# Patient Record
Sex: Male | Born: 1935 | Race: White | Hispanic: No | Marital: Married | Smoking: Current every day smoker
Health system: Southern US, Community
[De-identification: ages and names within clinical notes are randomized; demographics above are authoritative.]

## PROBLEM LIST (undated history)

## (undated) DIAGNOSIS — A499 Bacterial infection, unspecified: Secondary | ICD-10-CM

## (undated) DIAGNOSIS — G4700 Insomnia, unspecified: Secondary | ICD-10-CM

## (undated) DIAGNOSIS — I679 Cerebrovascular disease, unspecified: Secondary | ICD-10-CM

## (undated) DIAGNOSIS — I890 Lymphedema, not elsewhere classified: Secondary | ICD-10-CM

## (undated) DIAGNOSIS — G629 Polyneuropathy, unspecified: Secondary | ICD-10-CM

## (undated) DIAGNOSIS — I639 Cerebral infarction, unspecified: Secondary | ICD-10-CM

## (undated) DIAGNOSIS — N390 Urinary tract infection, site not specified: Secondary | ICD-10-CM

## (undated) DIAGNOSIS — I10 Essential (primary) hypertension: Principal | ICD-10-CM

## (undated) DIAGNOSIS — K219 Gastro-esophageal reflux disease without esophagitis: Secondary | ICD-10-CM

## (undated) DIAGNOSIS — E860 Dehydration: Secondary | ICD-10-CM

## (undated) DIAGNOSIS — F329 Major depressive disorder, single episode, unspecified: Secondary | ICD-10-CM

## (undated) DIAGNOSIS — D649 Anemia, unspecified: Secondary | ICD-10-CM

## (undated) DIAGNOSIS — R5381 Other malaise: Secondary | ICD-10-CM

## (undated) DIAGNOSIS — R4701 Aphasia: Secondary | ICD-10-CM

## (undated) DIAGNOSIS — G8929 Other chronic pain: Secondary | ICD-10-CM

## (undated) DIAGNOSIS — E876 Hypokalemia: Secondary | ICD-10-CM

## (undated) DIAGNOSIS — I69320 Aphasia following cerebral infarction: Secondary | ICD-10-CM

## (undated) DIAGNOSIS — F32A Depression, unspecified: Secondary | ICD-10-CM

## (undated) HISTORY — DX: Cerebral infarction, unspecified: I63.9

## (undated) HISTORY — DX: Essential (primary) hypertension: I10

## (undated) HISTORY — DX: Bacterial infection, unspecified: A49.9

## (undated) HISTORY — DX: Aphasia following cerebral infarction: I69.320

## (undated) HISTORY — DX: Gastro-esophageal reflux disease without esophagitis: K21.9

## (undated) HISTORY — DX: Urinary tract infection, site not specified: N39.0

## (undated) HISTORY — DX: Other malaise: R53.81

---

## 2001-02-28 ENCOUNTER — Encounter: Payer: Self-pay | Admitting: Emergency Medicine

## 2001-02-28 ENCOUNTER — Inpatient Hospital Stay (HOSPITAL_COMMUNITY): Admission: EM | Admit: 2001-02-28 | Discharge: 2001-03-03 | Payer: Self-pay

## 2001-03-03 ENCOUNTER — Encounter: Payer: Self-pay | Admitting: Cardiology

## 2001-03-11 ENCOUNTER — Encounter: Admission: RE | Admit: 2001-03-11 | Discharge: 2001-06-09 | Payer: Self-pay | Admitting: Cardiothoracic Surgery

## 2001-09-06 ENCOUNTER — Encounter: Payer: Self-pay | Admitting: Emergency Medicine

## 2001-09-06 ENCOUNTER — Inpatient Hospital Stay (HOSPITAL_COMMUNITY): Admission: EM | Admit: 2001-09-06 | Discharge: 2001-09-18 | Payer: Self-pay | Admitting: Emergency Medicine

## 2001-09-07 ENCOUNTER — Encounter: Payer: Self-pay | Admitting: Neurology

## 2001-09-09 ENCOUNTER — Encounter: Payer: Self-pay | Admitting: Neurology

## 2001-10-23 ENCOUNTER — Ambulatory Visit (HOSPITAL_COMMUNITY): Admission: RE | Admit: 2001-10-23 | Discharge: 2001-10-23 | Payer: Self-pay | Admitting: Internal Medicine

## 2001-10-23 ENCOUNTER — Encounter (HOSPITAL_BASED_OUTPATIENT_CLINIC_OR_DEPARTMENT_OTHER): Payer: Self-pay | Admitting: Internal Medicine

## 2001-12-23 ENCOUNTER — Encounter (HOSPITAL_BASED_OUTPATIENT_CLINIC_OR_DEPARTMENT_OTHER): Payer: Self-pay | Admitting: Internal Medicine

## 2001-12-23 ENCOUNTER — Ambulatory Visit (HOSPITAL_COMMUNITY): Admission: RE | Admit: 2001-12-23 | Discharge: 2001-12-23 | Payer: Self-pay | Admitting: Internal Medicine

## 2002-02-24 ENCOUNTER — Encounter: Payer: Self-pay | Admitting: Emergency Medicine

## 2002-02-24 ENCOUNTER — Encounter (INDEPENDENT_AMBULATORY_CARE_PROVIDER_SITE_OTHER): Payer: Self-pay | Admitting: *Deleted

## 2002-02-24 ENCOUNTER — Inpatient Hospital Stay (HOSPITAL_COMMUNITY): Admission: EM | Admit: 2002-02-24 | Discharge: 2002-02-27 | Payer: Self-pay | Admitting: Emergency Medicine

## 2002-03-03 ENCOUNTER — Ambulatory Visit (HOSPITAL_COMMUNITY): Admission: RE | Admit: 2002-03-03 | Discharge: 2002-03-03 | Payer: Self-pay | Admitting: Internal Medicine

## 2003-09-20 ENCOUNTER — Emergency Department (HOSPITAL_COMMUNITY): Admission: EM | Admit: 2003-09-20 | Discharge: 2003-09-20 | Payer: Self-pay | Admitting: Emergency Medicine

## 2004-01-19 ENCOUNTER — Emergency Department (HOSPITAL_COMMUNITY): Admission: EM | Admit: 2004-01-19 | Discharge: 2004-01-19 | Payer: Self-pay | Admitting: Emergency Medicine

## 2004-02-02 ENCOUNTER — Emergency Department (HOSPITAL_COMMUNITY): Admission: EM | Admit: 2004-02-02 | Discharge: 2004-02-02 | Payer: Self-pay | Admitting: Emergency Medicine

## 2004-04-26 ENCOUNTER — Encounter (HOSPITAL_BASED_OUTPATIENT_CLINIC_OR_DEPARTMENT_OTHER): Admission: RE | Admit: 2004-04-26 | Discharge: 2004-05-03 | Payer: Self-pay | Admitting: Internal Medicine

## 2004-08-15 ENCOUNTER — Emergency Department (HOSPITAL_COMMUNITY): Admission: EM | Admit: 2004-08-15 | Discharge: 2004-08-15 | Payer: Self-pay | Admitting: Emergency Medicine

## 2004-11-27 ENCOUNTER — Emergency Department (HOSPITAL_COMMUNITY): Admission: EM | Admit: 2004-11-27 | Discharge: 2004-11-27 | Payer: Self-pay | Admitting: Emergency Medicine

## 2004-11-29 ENCOUNTER — Inpatient Hospital Stay (HOSPITAL_COMMUNITY): Admission: EM | Admit: 2004-11-29 | Discharge: 2004-12-07 | Payer: Self-pay | Admitting: Emergency Medicine

## 2005-07-26 ENCOUNTER — Ambulatory Visit (HOSPITAL_COMMUNITY): Admission: RE | Admit: 2005-07-26 | Discharge: 2005-07-26 | Payer: Self-pay | Admitting: Internal Medicine

## 2006-11-10 ENCOUNTER — Inpatient Hospital Stay (HOSPITAL_COMMUNITY): Admission: EM | Admit: 2006-11-10 | Discharge: 2006-11-12 | Payer: Self-pay | Admitting: Emergency Medicine

## 2006-11-15 ENCOUNTER — Ambulatory Visit: Payer: Self-pay | Admitting: Gastroenterology

## 2007-07-06 ENCOUNTER — Ambulatory Visit: Payer: Self-pay | Admitting: Surgery

## 2007-07-06 ENCOUNTER — Encounter (INDEPENDENT_AMBULATORY_CARE_PROVIDER_SITE_OTHER): Payer: Self-pay | Admitting: Emergency Medicine

## 2007-07-06 ENCOUNTER — Emergency Department (HOSPITAL_COMMUNITY): Admission: EM | Admit: 2007-07-06 | Discharge: 2007-07-06 | Payer: Self-pay | Admitting: Emergency Medicine

## 2008-12-31 ENCOUNTER — Inpatient Hospital Stay (HOSPITAL_COMMUNITY): Admission: EM | Admit: 2008-12-31 | Discharge: 2009-01-04 | Payer: Self-pay | Admitting: Emergency Medicine

## 2010-11-06 LAB — POCT CARDIAC MARKERS
CKMB, poc: <1 ng/mL — ABNORMAL LOW (ref 1.0–8.0)
Myoglobin, poc: 69.4 ng/mL (ref 12–200)
Troponin i, poc: <0.05 ng/mL (ref 0.00–0.09)

## 2010-11-06 LAB — BASIC METABOLIC PANEL
BUN: 13 mg/dL (ref 6–23)
BUN: 17 mg/dL (ref 6–23)
BUN: 7 mg/dL (ref 6–23)
CO2: 22 mEq/L (ref 19–32)
CO2: 23 mEq/L (ref 19–32)
CO2: 23 mEq/L (ref 19–32)
Calcium: 8.9 mg/dL (ref 8.4–10.5)
Chloride: 113 mEq/L — ABNORMAL HIGH (ref 96–112)
Chloride: 118 mEq/L — ABNORMAL HIGH (ref 96–112)
Creatinine, Ser: 0.65 mg/dL (ref 0.4–1.5)
Creatinine, Ser: 0.71 mg/dL (ref 0.4–1.5)
Creatinine, Ser: 0.75 mg/dL (ref 0.4–1.5)
GFR calc Af Amer: >60 mL/min (ref >60)
GFR calc non Af Amer: >60 mL/min (ref >60)
Potassium: 3.5 mEq/L (ref 3.5–5.1)
Potassium: 3.8 mEq/L (ref 3.5–5.1)
Sodium: 140 mEq/L (ref 135–145)
Sodium: 145 mEq/L (ref 135–145)

## 2010-11-06 LAB — CULTURE, BLOOD (ROUTINE X 2)
Culture: NO GROWTH
Culture: NO GROWTH

## 2010-11-06 LAB — URINALYSIS, ROUTINE W REFLEX MICROSCOPIC
Bilirubin Urine: NEGATIVE
Glucose, UA: NEGATIVE mg/dL
Specific Gravity, Urine: 1.013 (ref 1.005–1.030)
pH: 7 (ref 5.0–8.0)

## 2010-11-06 LAB — GLUCOSE, CAPILLARY
Glucose-Capillary: 102 mg/dL — ABNORMAL HIGH (ref 70–99)
Glucose-Capillary: 197 mg/dL — ABNORMAL HIGH (ref 70–99)
Glucose-Capillary: 84 mg/dL (ref 70–99)
Glucose-Capillary: 88 mg/dL (ref 70–99)
Glucose-Capillary: 92 mg/dL (ref 70–99)
Glucose-Capillary: 94 mg/dL (ref 70–99)

## 2010-11-06 LAB — CBC
HCT: 47.3 % (ref 39.0–52.0)
Hemoglobin: 15.2 g/dL (ref 13.0–17.0)
Hemoglobin: 16 g/dL (ref 13.0–17.0)
MCHC: 33.7 g/dL (ref 30.0–36.0)
MCHC: 34.2 g/dL (ref 30.0–36.0)
MCV: 91.7 fL (ref 78.0–100.0)
MCV: 92.6 fL (ref 78.0–100.0)
Platelets: 103 10*3/uL — ABNORMAL LOW (ref 150–400)
Platelets: 96 10*3/uL — ABNORMAL LOW (ref 150–400)
Platelets: 97 10*3/uL — ABNORMAL LOW (ref 150–400)
RBC: 4.91 MIL/uL (ref 4.22–5.81)
RBC: 5.11 MIL/uL (ref 4.22–5.81)
RDW: 14.6 % (ref 11.5–15.5)
WBC: 7.3 10*3/uL (ref 4.0–10.5)

## 2010-11-06 LAB — COMPREHENSIVE METABOLIC PANEL
Albumin: 3.1 g/dL — ABNORMAL LOW (ref 3.5–5.2)
BUN: 22 mg/dL (ref 6–23)
Creatinine, Ser: 0.77 mg/dL (ref 0.4–1.5)
GFR calc non Af Amer: >60 mL/min (ref >60)
Glucose, Bld: 105 mg/dL — ABNORMAL HIGH (ref 70–99)
Potassium: 4.6 mEq/L (ref 3.5–5.1)
Sodium: 142 mEq/L (ref 135–145)
Total Bilirubin: 0.3 mg/dL (ref 0.3–1.2)
Total Protein: 6.5 g/dL (ref 6.0–8.3)

## 2010-11-06 LAB — DIFFERENTIAL
Basophils Absolute: 0 10*3/uL (ref 0.0–0.1)
Eosinophils Relative: 2 % (ref 0–5)
Lymphs Abs: 1.7 10*3/uL (ref 0.7–4.0)
Monocytes Absolute: 0.4 10*3/uL (ref 0.1–1.0)
Neutrophils Relative %: 64 % (ref 43–77)

## 2010-11-06 LAB — CK TOTAL AND CKMB (NOT AT ARMC)
CK, MB: 1.7 ng/mL (ref 0.3–4.0)
Relative Index: INVALID (ref 0.0–2.5)

## 2010-11-06 LAB — URINE CULTURE
Colony Count: NO GROWTH
Culture: NO GROWTH

## 2010-11-06 LAB — CARDIAC PANEL(CRET KIN+CKTOT+MB+TROPI)
CK, MB: 1.5 ng/mL (ref 0.3–4.0)
Relative Index: INVALID (ref 0.0–2.5)
Troponin I: 0.01 ng/mL (ref 0.00–0.06)

## 2010-11-06 LAB — TROPONIN I: Troponin I: 0.02 ng/mL (ref 0.00–0.06)

## 2010-12-12 NOTE — Discharge Summary (Signed)
NAME:  Bradley Boyle, Bradley Boyle NO.:  [id number]   MEDICAL RECORD NO.:  [id number]          PATIENT TYPE:  INP   LOCATION:  6737                         FACILITY:  [facility]   PHYSICIAN:  Lonia Blood, M.D.DATE OF BIRTH:  03/04/1930   DATE OF ADMISSION:  12/31/2008  DATE OF DISCHARGE:                               DISCHARGE SUMMARY   PRIMARY CARE PHYSICIAN:  Medical laboratory scientific officer.   DISCHARGE DIAGNOSES:  1. Severe hypotension.      a.     No clinical evidence of sepsis.      b.     Most consistent with simple dehydration.      c.     Felt to be secondary to low p.o. intake combined with Lasix       therapy.  2. Initial concern for right lower lobe pneumonia - no evidence of      such in follow-up.  3. 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  4. Hypertension - Norvasc initiated in place of Lasix.  5. Hypothyroidism - TSH at goal.  6. Diet-controlled diabetes mellitus.  7. History of gastric and duodenal arteriovenous malformations -      hemoglobin stable during hospital stay.   DISCHARGE MEDICATIONS:  1. Claritin 10 mg p.o. daily.  2. Lyrica 75 mg p.o. t.i.d.  3. Protonix 40 mg p.o. daily.  4. Trazodone 25 mg p.o. every night.  5. Multivitamin 1 p.o. daily.  6. Ascorbic acid 500 mg p.o. daily.  7. Baclofen 5 mg p.o. b.i.d.  8. Tylenol 650 mg p.o. q.4 h. p.r.n.  9. Norvasc 10 mg p.o. daily.   FOLLOW UP:  Ongoing care will be provided by the attending of record at  the patient's skilled nursing facility of choice.  The patient should  receive a BMET within a week's time to reassess his BUN and creatinine  ratio.  He should be followed closely for his oral intake.  It should be  assured that he encouraged to maintain adequate oral intake at all  times.   CONSULTATIONS:  [facility] per ED physician.   PROCEDURE:  CT scan of the head on December 31, 2008:  No acute intracranial  abnormalities.  Old left middle cerebral artery distribution CVA.  Moderate generalized atrophy including cerebellar atrophy.   HOSPITAL COURSE:  Mr. Bradley Boyle is a very pleasant 77 year old  gentleman who suffered a significantly large left middle artery  distribution CVA in the past.  This has left him with right hemiparesis  and persistent dysarthria.  The patient was brought to the hospital  after he was found to be somewhat lethargic and complaining of  dizziness.  In the nursing home, he had an initial blood pressure of  70/45.  Heart rate was in the 100 to 140 range at that time.  The  patient was given 500 mL normal saline bolus by EMS.  On arrival in the  emergency room, he was slurring his speech.  It was not clear that this  actually was most likely his baseline to  the ER physician.  CT scan of  the head was obtained and was unrevealing.  Neurology consultation was  carried out, however.  The neurologist did not feel that this  represented an acute neurologic event and therefore signed off on the  patient.  The patient was admitted to the acute unit.  He was hydrated.  Chest x-ray in the ER had revealed a question of possible right lower  lobe infiltrate.  The patient was therefore empirically placed on  antibiotics.  With hydration, the patient's symptoms improved  significantly.  Follow-up chest x-ray failed to reveal any evidence of a  right lower lobe pneumonia.  They were therefore discontinued.  At the  present time, the patient's Lasix has been discontinued.  Would continue  IV fluid hydration, and we are initiating Norvasc 10 mg daily for blood  pressure control.  We will assess patient's blood pressure for adequate  control on Norvasc.   The patient remained stable overnight.  It is anticipated that he will  be cleared for return to the skilled nursing facility on the medication  regimen listed above.      Lonia Blood, M.D.  Electronically  Signed     JTM/MEDQ  D:  01/03/2009  T:  01/03/2009  Job:  [id number]   cc:   [facility]

## 2010-12-12 NOTE — H&P (Signed)
NAME:  ELIZEO, RODRIQUES NO.:  [id number]   MEDICAL RECORD NO.:  [id number]          PATIENT TYPE:  INP   LOCATION:  6737                         FACILITY:  [facility]   PHYSICIAN:  Zannie Cove, MD     DATE OF BIRTH:  1930-03-27   DATE OF ADMISSION:  12/31/2008  DATE OF DISCHARGE:                              HISTORY & PHYSICAL   PRIMARY CARE PHYSICIAN:  Karlene Einstein, M.D.   CHIEF COMPLAINT:  Low blood pressure.   HISTORY OF PRESENTING ILLNESS:  Mr. Lage is a 77 year old gentleman  with multiple medical problems, resident of a skilled nursing facility,  sent to the emergency room as he reported feeling dizzy today and was  noted to have a blood pressure of 70/45 and heart rate of 100 to 140  range.  The EMS on arrival gave him 500 mL normal saline bolus, which  improved his blood pressure.  The patient denies any fevers or chills.  No chest pain, no shortness of breath, no palpitations.  He denies any  cough.  No abdominal pain, no nausea, no vomiting, no diarrhea, no  dysuria, or urinary frequency.  No new medications or change in his  medications.  The hypotension and tachycardia had resolved upon arrival  in the emergency department.   PAST MEDICAL HISTORY:  1. CVA with residual aphasia.  2. Diabetes.  3. Chronic pain/neuropathy.  4. Hypotension.  5. Anemia.  6. Depression.  7. Gastroesophageal reflux disease.  8. AVMs of the stomach and duodenum.  9. Constipation.   PAST SURGICAL HISTORY:  No prior surgeries.   MEDICATIONS:  Baclofen 5 mg p.o. twice a day, Senokot 1 tablet twice a  day, Claritin 10 mg once a day, Lyrica 75 mg three times a day, Prilosec  20 mg once a day, trazodone 25 mg at bedtime, multivitamin 1 tablet once  a day, MiraLax 17 g once a day, Vicodin 5/500 one tablet four times a  day as needed, Lasix 20 mg once a day, and vitamin C 500 mg once a day.   ALLERGIES:  The patient is allergic to [facility].   SOCIAL HISTORY:   He lives at a skilled nursing facility.  Needs  assistance in almost all activities of daily living.  Former smoker and  former alcoholic.  Does not smoke or use alcohol currently.   FAMILY HISTORY:  Unknown.   REVIEW OF SYSTEMS:  A 12-system review negative, except per HPI.   PHYSICAL EXAMINATION:  VITAL SIGNS:  Temperature is 97.9, pulse is 68,  blood pressure 146/91, respirations 16, and sating 96% on 2 L.  GENERAL:  He is alert, awake, oriented to place and person, has a  dysarthric speech.  HEENT:  Pupils equal and reactive to light.  Extraocular movements  intact.  Dry mucous membranes.  CARDIOVASCULAR:  S1, S2.  Regular rate and rhythm.  No murmurs, rubs, or  gallops.  LUNGS:  Scattered rhonchi in the right lung, mid and lung base.  CHEST:  Clear.  ABDOMEN:  Soft, nontender.  Positive bowel sounds.  Question of  splenomegaly.  EXTREMITIES:  No  edema, clubbing, or cyanosis.   LABORATORY DATA:  White count of 6.1 with 64% neutrophils, hemoglobin  15.2, and platelets 96.  Chemistry:  Sodium 142, potassium 4.6, chloride  111, bicarb 24, BUN 22, creatinine 0.7, glucose 105, AST 38, ALT 28,  total protein 6.5, albumin 3.1, CK-MB less than 1.  Troponin less than  0.05.  EKG; sinus with the rate of 66, left axis deviation, left  anterior fascicular block, no acute ST-T changes.  Urinalysis is normal.  Chest x-ray shows right basilar atelectasis and possible pneumonia.  CT  head with no acute intracranial abnormalities.   ASSESSMENT AND PLAN:  A 77 year old gentleman with sepsis likely  secondary to healthcare associated pneumonia.  1. Hypotension.  Tachycardia has resolved with the one time fluid      bolus.   Although clinically, the patient denies any symptoms of pneumonia and  does not have leukocytosis, given the initial presentation with blood  pressure of 70/44 and heart rate of 140 and presence of an infiltrate on  x-ray, we will treat his healthcare associated  pneumonia.   The patient has already received a dose of vancomycin and Zosyn in the  ED, we will continue the same per pharmacy protocol.  In addition check  blood, urine, and serum cultures.   Hydration with IV fluids, normal saline at 100 mL an hour.   We will also get 2 more sets of cardiac enzymes to rule out any acute  cardiac event and we will monitor on telemetry.  1. Diabetes.  Start the patient on ADA diet with sliding scale      insulin.  2. Chronic pain neuropathy.  Continue baclofen, Lyrica and Vicodin.  3. Deep vein thrombosis prophylaxis with Lovenox 40 mg subcu daily.   CODE STATUS:  The patient is a DNR/DNI.      Zannie Cove, MD  Electronically Signed     PJ/MEDQ  D:  12/31/2008  T:  01/01/2009  Job:  [id number]

## 2010-12-12 NOTE — Consult Note (Signed)
NAME:  Bradley Boyle, Bradley Boyle NO.:  [id number]   MEDICAL RECORD NO.:  [id number]          PATIENT TYPE:  INP   LOCATION:  6737                         FACILITY:  [facility]   PHYSICIAN:  Bradley Boyle, MDDATE OF BIRTH:  1929/08/16   DATE OF CONSULTATION:  12/31/2008  DATE OF DISCHARGE:                                 CONSULTATION   Bradley Boyle is seen today in consultation at the request of Bradley Boyle  of the [facility] Emergency Department for acute CVA.  The patient was  last known normal at 8:11 p.m. this evening.   HISTORY OF PRESENT ILLNESS:  This is a 77 year old right-handed  Caucasian male who presents from [facility].  I did  speak personally with the patient's nurse on the phone at the nursing  facility to obtain the course of tonight's events.  The nurse who was  with Bradley Boyle at the time of the onset of this symptoms states that  the patient started to complain of some lightheadedness and dizziness  earlier in the day.  This evening, he was feeling quite fatigued and the  nurses were accompanied him to his room.  Upon sitting down, he became  quite diaphoretic and pale.  They went to check his blood pressure and  stated that his blood pressure continued to drop, the lowest pressure  they recorded was 70/44.  At the same time, they were monitoring his  oxygen saturation, which dropped from 94% to 88%.  At the nursing  facility, his blood glucose was checked as he has had a history of  hypoglycemia.  The patient's blood glucose was 110.  EMS was called and  the patient was transported to [facility] Emergency Department for  severe hypotension.  In the [facility] Emergency Department, it was  noted that the patient had aphasic speech and a code stroke was called.   OTHER PAST MEDICAL HISTORY:  1. History of a large left MCA ischemic stroke with residual right-      sided weakness, spasticity and moderate to severe expressive  aphasia.  2. Hypertension.  3. History of an upper GI bleed secondary to AVM in April 2008.  4. Depression.  5. Type 2 diabetes.  6. Hypothyroidism.  7. Anemia.  8. Hypokalemia.  9. Hypoglycemia.  10.History of acute renal insufficiency.  11.Peptic ulcer disease with duodenal ulcer bleed in 2003.  12.Gastroesophageal reflux disease.   The patient has risk factors for TIA and stroke including hypertension  and type 2 diabetes.   CURRENT MEDICATIONS:  Obtained from the nursing facility;  1. Baclofen 5 mg twice a day.  2. Senokot.  3. Multivitamins.  4. Prilosec 20 mg daily.  5. Lyrica 75 mg, three times per day.  6. Percocet p.r.n. pain.  7. Milk of magnesia.  8. Claritin 10 mg daily.  9. Trazodone 25 mg daily at bedtime.   The patient has no known drug allergies.   SOCIAL HISTORY:  He does continue to smoke tobacco products.  He has no  history of alcohol or illicit drug use.  He lives at [facility].   REVIEW OF SYSTEMS:  As detailed above under HPI, otherwise the balance  of 10 systems is unremarkable.   PHYSICAL EXAMINATION:  VITAL SIGNS:  Current weight is 74 kg,  temperature 98.4, blood pressure 146/91, heart rate 68, respiratory rate  16.  HEENT:  Head is normocephalic, atraumatic.  Pupils are equal, round,  reactive to light, and accommodation.  Extraocular movements are full  without nystagmus.  There is no gaze preference.  Oropharynx is clear of  ulcers and exudate.  Palate is symmetric.  NECK:  Supple.  LUNGS:  Coarse breath sounds, right greater than left lower lobe.  HEART:  Regular rate and rhythm without murmurs, rubs, or gallops.  ABDOMEN:  Benign.  EXTREMITIES:  +1 lower extremity trace edema without clubbing or  cyanosis.  NEUROLOGIC:  The patient is alert and oriented x3 to person, place, and  time.  Attention is within normal limits.  Speech is aphasic.  The  patient's aphasia does wax and wane.  At times, he is only able to  say  one or two words and then intermittently he gets out whole sentences.  Cranial nerves II through XII are intact with the exception of a right  lower facial droop.  MUSCULOSKELETAL:  The patient has increased tone especially in the right  upper extremity with severe residual spasticity.  His right upper  extremity strength is 1/5, right lower extremity strength is 3/5.  The  patient is able to break gravity with the right lower extremity, but the  leg does drift back down; however, it does not touch the bed before a  count of 5.  Left upper and lower extremities are 5/5 throughout.  Toes  are downgoing on the left and upgoing on the right.  Sensory appears to  be intact throughout.  Gait is deferred secondary to acute CVA.  The  patient has smooth pursuit on finger-to-nose testing with the left upper  extremity and heel-to-shin with the left lower extremity.   LABORATORY STUDIES:  Chem-7 is within normal limits with the exception  of low glucose at 92.  CBC is within normal limits with the exception of  platelets low at 96.  Cardiac enzymes are negative x1 for acute  ischemia.  Urinalysis is unremarkable and without infectious process.  Coagulation studies are not performed.  Liver function testing is within  normal limits with the exception of alkaline phosphatase elevated at  116, albumin low at 3.1.  Calcium is within normal limits at 8.5.  Chest  x-ray showing a right lower lobe pneumonia with a previously noted  aortic arch aneurysm.  CT of the head is without acute bleed showing no  new hypodensities and a large chronic left MCA ischemic stroke.   ASSESSMENT AND PLAN:  This is a very pleasant 77 year old Caucasian male  who presents as an acute cerebrovascular accident.  However, his history  and exam are inconsistent.  His history is c/w hypotension, hypoxia,  presyncopal episode and his exam, neurologic deficits are consistent  with his chronic left middle cerebral artery  ischemic stroke with  residual right-sided weakness, right upper extremity spasticity and  aphasic speech.  His aphasia does fluctuate and at times he is able to  get out whole sentences, at other times, he appears mute and sometimes  he only is able to find a few words.  This may be secondary to his  ongoing infectious process.  It is likely that some of his residual  stroke symptoms have been exacerbated by his right lower lobe pneumonia.  I doubt the patient has a new ischemic stroke and would not pursue a  complete stroke workup.  The patient is not on any medications for  secondary stroke prevention.  This may be secondary to the two  gastrointestinal bleeds, which he has had over the past 7 years and his  thrombocytopenia.  He is also not on statin therapy and I do not know  the status of his lipids.  I would encourage the patient towards tobacco  cessation.  This patient did not receive IV TPA secondary to chronic  symptoms with no new neurologic deficits on exam.  He did have a stroke  scale of 10, but this is secondary to his residual deficits.  In  addition, the patient has a platelet count of 96 which is less than 100,  which is contraindicated for IV TPA.  This patient will be followed in  the a.m. by [facility].      Bradley Fiddler, MD  Electronically Signed     NA/MEDQ  D:  01/01/2009  T:  01/01/2009  Job:  [id number]

## 2010-12-12 NOTE — Discharge Summary (Signed)
NAME:  Bradley Boyle, Bradley Boyle NO.:  [id number]   MEDICAL RECORD NO.:  [id number]          PATIENT TYPE:  INP   LOCATION:  6737                         FACILITY:  [facility]   PHYSICIAN:  Pedro Earls, MD     DATE OF BIRTH:  17-Apr-1930   DATE OF ADMISSION:  12/31/2008  DATE OF DISCHARGE:                               DISCHARGE SUMMARY   ADDENDUM   I have seen and examined the patient.  The patient is stable to be  discharged.  There were no acute events happened overnight.  Discuss the  case with the case management, escrow.  Please refer to the dictated med  list for the discharge medication.      Pedro Earls, MD  Electronically Signed     NS/MEDQ  D:  01/04/2009  T:  01/05/2009  Job:  [id number]

## 2010-12-15 NOTE — Discharge Summary (Signed)
NAME:  Bradley Boyle, BRISK NO.:  [id number]   MEDICAL RECORD NO.:  [id number]          PATIENT TYPE:  INP   LOCATION:  3040                         FACILITY:  [facility]   PHYSICIAN:  Fleet Contras, M.D.    DATE OF BIRTH:  [birth year]   DATE OF ADMISSION:  11/29/2004  DATE OF DISCHARGE:  12/06/2004                                 DISCHARGE SUMMARY   PRESENTATION:  Bradley Boyle is a 77 year old Caucasian gentleman who is a  resident at [facility], where he was picked up by the  EMS and brought to the emergency room at [facility].  He presented  with a few days of pain, swelling and redness of his right hand associated  with fever and weakness; this had been progressively getting worse.  He  denied any injury to the right hand and had no history of any kind of  trauma.  On evaluation in the emergency room, he was noted to be febrile,  mildly dehydrated, in mild-to-moderate painful distress.  The dorsum of his  right hand was swollen, erythematous and very tender to touch.  He was  evaluated by the emergency room physician with an x-ray of the right hand  which did not show any acute bony injuries.  Routine blood counts showed  elevated white count at over 18,000.  He has comorbid conditions of diabetes  mellitus, history of CVA with right spastic hemiplegic and he was more or  less wheelchair- and bed-bound; due to these comorbid conditions, he was  thought to be best served by inpatient hospitalization and intravenous  antibiotic therapy; he was therefore admitted to a medical bed and started  on intravenous fluids and intravenous antibiotics.   HOSPITAL COURSE:  On admission, the patient was started on half normal  saline at 75 mL an hour as well as intravenous Zosyn 3.375 g q.6 h.  His  right hand was elevated and Physical Therapy and Occupational Therapy  consultations were requested.  The patient began to improve with  defervescence as  well as his white count returned to normal limits on the  above therapy; however, on Dec 02, 2004, he began to run another bout of  fever up to 103.  Routine septic workup revealed a urinary tract infection  and he was treated with oral ciprofloxacin with improvement in fever and  maintenance of normal white blood count levels.  Physical Therapy and  Occupational Therapy evaluated the patient and recommended that he will be  best served in a skilled nursing facility rather than an extended care  facility.  He has now been evaluated by the case manager and efforts have  been made to secure a skilled nursing facility for his discharge.  Further  problems during his hospitalization included constipation which was treated  with laxative and this was followed by diarrhea and hypokalemia which was  repleted both intravenously and orally.  He also has normochromic/normocytic  anemia which was thought to be due to anemia of chronic disease.  Efforts to  get a Hemoccult have so far proved impossible.  Today, he is  not in acute  respiratory or painful distress.  He is lying comfortably in bed.  His vital  signs show a blood pressure of 164/83, heart rate of 75, respiratory rate of  20, temperature is 97.1.  Oxygen saturation on room air is 92%.  His neck is  supple with no elevated JVD.  He is well-hydrated.  He is not icteric and he  is not cyanotic.  His chest shows good air entry bilaterally with no added  sounds.  Heart sounds 1 and 2 are heard with no murmurs, no S3 gallops.  Abdomen is soft and nontender.  No masses.  Bowel sounds are present.  Extremities show no edema or calf tenderness of the lower extremities.  His  right hand shows resolution of swelling, erythema and tenderness of the  dorsum of the right hand.  CNS:  He is alert and oriented x3.  He has a  right-sided spastic hemiplegia.   Recent laboratory data showed white count of 5.1, hemoglobin 9.7, hematocrit  28.8, platelet count  of 232,000.  His sodium on Dec 05, 2004 shows a sodium  of 135, potassium of 3.6, chloride 102, bicarbonate of 26, BUN of 9,  creatinine 0.7, glucose of 191.  Blood cultures x2 sets obtained on  admission and on Dec 01, 2004 have all returned negative.   ASSESSMENT:  Bradley Boyle is a 77 year old Caucasian gentleman admitted  from assisted living facility with cellulitis of the right hand.  He has  received intravenous and oral antibiotics, and is considered stable for  discharge now.  He has been recommended for a skilled nursing facility that  is appropriate for his level of care.   DISCHARGE DIAGNOSES:  1.  Cellulitis of the right hand.  2.  Dehydration, resolved.  3.  Hypopotassemia, resolved.  4.  Anemia of chronic disease.  5.  Diabetes mellitus, well-controlled.  6.  Hypertension, moderately controlled.  7.  History of cerebrovascular accident, unchanged.   DISCHARGE MEDICATIONS:  Discharge medications include:  1.  Multivitamins -- one a day.  2.  Prilosec 20 mg daily.  3.  Cardizem CD 180 mg daily.  4.  Avandia 4 mg daily.  5.  Iron sulfate 325 mg daily.  6.  Senna two tablets daily.  7.  Lasix 40 mg with potassium 20 mEq once a day p.r.n. for leg swelling.  8.  Duragesic patch 25 mcg to be applied q.72 h.  9.  Vicodin 5/500 mg one to two tablets q.6 h. p.r.n.  10. Nortriptyline 50 mg nightly p.r.n.  11. Ativan 0.5 mg one p.o. nightly p.r.n.  12. Albuterol MDI two puffs q.6 h. p.r.n.  13. Aspirin 325 mg daily.  14. Baclofen 10 mg one p.o. b.i.d.  15. Extra-Strength Tylenol 500 mg one to two tablets q.6 h. p.r.n.  16. Lexapro 10 mg p.o. daily.  17. Glucerna one can p.o. t.i.d.  18. Keflex 500 mg one p.o. four times daily for 5 more days.  19. Ciprofloxacin 250 mg one p.o. b.i.d. for 3 more days.   ACTIVITY:  He is to continue with physical therapy and occupational therapy  at the skilled nursing facility.  DIET:  He is to stay on an 1800-calorie kilocalorie ADA diet  and is to apply  pressure area care.   FOLLOWUP:  Followup will be by me in 3 weeks; he is to call for appointment  at [phone].   DISPOSITION:  To skilled nursing facility.   CONDITION ON DISCHARGE:  Stable.      EA/MEDQ  D:  12/06/2004  T:  12/06/2004  Job:  [id number]

## 2010-12-15 NOTE — Consult Note (Signed)
NAME:  Bradley Boyle, Bradley Boyle NO.:  [id number]   MEDICAL RECORD NO.:  [id number]          PATIENT TYPE:  INP   LOCATION:  1826                         FACILITY:  [facility]   PHYSICIAN:  Rachael Fee, MD   DATE OF BIRTH:  [birth year]   DATE OF CONSULTATION:  DATE OF DISCHARGE:                                 CONSULTATION   CONSULTATION REQUESTED:  Dr. Lonia Blood from [facility].   Note:  Bradley Boyle is covering, Drs. Jyothi Mann and  Jeani Hawking this weekend.   REASON FOR CONSULTATION:  Melena, anemia.   HPI:  Bradley Boyle is a 77 year old nursing home resident who cannot  provide much of his own history.  He had a duodenal ulcer, H. pylori  positive in 2003, seen by Dr. Charna Elizabeth.  Since then he has been a  resident at a nearby nursing home for his other chronic diseases  including diabetes, hypothyroidism, status post left-sided stroke in  2003.  He was found recently in his nursing home to have several dark  tarry stools and sent to the emergency room for evaluation.  Here in the  emergency room, he had lab tests performed showing a hemoglobin of 6.5,  BUN of 113, creatinine of 1.7.  Normal liver tests and a normal INR.  He  has 205,000 platelets.  His initial blood pressure in the emergency room  was 78/48 but since then he has responded to some fluid bolus and his  pressure most recently is 98/58 with a pulse of 80.  The patient is  arousable but does not answer my questions intelligibly, so I am not  able to determine whether he has had nausea, vomiting, abdominal pains,  hematemesis, or hematochezia.  He currently denies abdominal pain.   REVIEW OF SYSTEMS:  The patient is unable to give review of system due  to his mental status.   PAST MEDICAL HISTORY:  1. Diabetes.  2. Hypertension.  3. History of peptic ulcer disease in 2003 with a large duodenal      ulcer.  4. H. pylori positive.  5. GERD.  6. Hyperlipidemia.  7.  Depression.   CURRENT MEDICATIONS:  At his nursing home:  Lasix, potassium, Prilosec  20 mg once daily, Lyrica, Cymbalta, methadone 10 mg daily, simvastatin,  metformin, Lotensin, aspirin 325 mg once daily, Baclofen, Cardizem,  Senokot, multivitamin, ferrous sulfate once daily.   ALLERGIES:  No known drug allergies.   SOCIAL HISTORY:  Lives at [facility].  Nonsmoker.  Nondrinker.   FAMILY HISTORY:  Noncontributory.   PHYSICAL EXAMINATION:  VITAL SIGNS:  Most recent blood pressure 98/58,  pulse 80, afebrile, breathing 14 breaths per minute.  CONSTITUTIONAL:  Elderly, chronically ill-appearing man, lying in bed,  sleeping but arousable.  Alert to self.  Mumbles his answers.  Does not  seem that he knows where he is or why he is here.  Does not answer  appropriately.  HEENT:  Eyes:  Extraocular movements are intact.  Mouth:  Oropharynx  moist.  No lesions.  NECK:  Supple. No lymphadenopathy.  CARDIOVASCULAR:  Heart:  Regular rate and rhythm.  LUNGS:  Clear to auscultation bilaterally.  ABDOMEN:  Soft, nontender, nondistended.  Normal bowel sounds.  EXTREMITIES:  No lower extremity edema.  SKIN:  No rashes, lesions are visible.   ASSESSMENT AND PLAN:  This is a 77 year old male with a history of a  large duodenal ulcer, Helicobacter pylori positive in 2003, now with  melena and significant anemia, hypotensive on admission.   Given his history of peptic ulcer disease in the past, he is at risk for  recurrent peptic ulcer disease.  That may indeed be what has been  bleeding and causing his anemia and shock.  May also have other upper GI  source including Dieulafoy lesions, neoplasia, but that seems less  likely in this setting.   The plan is to hold his aspirin, transfuse blood.  He will need three to  four units, at least, I suspect.  He will be observed in the Intensive  Care Unit.  He will be started on IV Protonix twice daily, some given in  the emergency room.  Keep  him NPO for an EGD to be done tomorrow after  he receives his blood transfusions and hope he perks up a bit.      Rachael Fee, MD  Electronically Signed     DPJ/MEDQ  D:  11/10/2006  T:  11/10/2006  Job:  [id number]   cc:   Anselmo Rod, M.D.  Karlene Einstein, M.D.

## 2010-12-15 NOTE — Discharge Summary (Signed)
NAME:  SEITH, AIKEY NO.:  [id number]   MEDICAL RECORD NO.:  [id number]          PATIENT TYPE:  INP   LOCATION:  2926                         FACILITY:  [facility]   PHYSICIAN:  Lonia Blood, M.D.       DATE OF BIRTH:  1929/08/26   DATE OF ADMISSION:  11/10/2006  DATE OF DISCHARGE:                               DISCHARGE SUMMARY   PRIMARY CARE PHYSICIAN:  Karlene Einstein, M.D.   DISCHARGE DIAGNOSES:  1. Hypovolemic shock - resolved.  2. Acute blood loss anemia - resolved.  3. 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  4. Altered mental status on admission secondary to the hypotension -      resolved.  5. 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  6. Hypertension.  7. Diabetes mellitus type 2.  8. Hypothyroidism.  9. Depression.  10.Acute renal insufficiency/dehydration on admission - resolving.   CONDITION AT DISCHARGE:  Mr.  Devino will be transferred back to the  skilled nursing facility.  At the skilled nursing facility he will need  a followup basic metabolic profile and CBC 24-48 hours after arrival to  assure stability of his renal function as well as CBC.  The patient  continues to be a do not resuscitate.   DISCHARGE MEDICATIONS:  1. Baclofen 5 mg by mouth twice a day.  2. Senokot by mouth daily.  3. Multivitamin by mouth daily.  4. Nu-Iron 150 mg daily.  5. Prilosec OTC 20 mg twice daily.  6. Cymbalta 90 mg daily.  7. Lyrica 50 mg three times a day.  8. Percocet 5/325 one by mouth every 4 hours as needed for pain.  9. Tylenol 500 mg one by mouth every 4 hours as needed for pain.   PROCEDURES DURING THIS ADMISSION:  1. On November 10, 2006, the patient underwent transfusion of 3 units of      packed red blood cells.  2. On November 10, 2006, the  patient underwent a portable chest x-ray      with findings of no acute cardiopulmonary disease.  3. On November 11, 2006, the patient underwent an upper endoscopy by Dr.      Loreta Ave with findings of duodenal and gastric arteriovenous      malformations.   CONSULTATIONS DURING THIS ADMISSION:  The patient was seen in  consultation by Dr. Rob Bunting from gastroenterology.   HISTORY AND PHYSICAL:  For admission history and physical, refer to the  dictated H&P done by Dr. Lavera Guise November 10, 2006.   HOSPITAL COURSE:  #1 - HYPOVOLEMIC HEMORRHAGIC SHOCK WITH HYPOTENSION  SECONDARY TO GASTROINTESTINAL BLEEDING.  Mr. Sevillano was admitted to  the intensive care unit of [facility] where he received  intensive fluid resuscitation and transfusion with packed red blood  cells.  The patient's vital signs have stabilized and his discharge  systolic blood pressure is about 120.  The patient's hemoglobin rose  appropriately and the last hemoglobin that we checked was at 11.8.  We  do expect over the course of the next 24-48 hours as the patient's fluid  status reequilibrates that his hemoglobin may drift down to probably a  baseline hemoglobin of 10.  Clinically, Mr. Caraway has stopped  hemorrhaging and an endoscopy confirmed the above findings.  At this  point in time we would recommend discontinuation of the aspirin since  the patient had a real life-threatening bleeding on aspirin and  consideration should be given of maybe using Plavix after careful  discussion with the family.  For now the patient, though, should be off  of any anticoagulation/antiplatelet therapy for at least 1 week.  The  patient should be continued on a proton pump inhibitor and we have  increased that from a once-a-day dose to a twice-a-day dose.   #2 - DIABETES MELLITUS TYPE 2.  Mr. Elwood measured hemoglobin A1c  has been at 5.2.  At this point in time, given the fact that the patient  has renal insufficiency,  I will discontinue his metformin and he will  need basically a good diet and monitoring for hyperglycemia.   #3 - ACUTE RENAL INSUFFICIENCY.  Upon admission the patient's BUN was  about 100 and creatinine rose at 1.6.  With careful hydration, these  numbers have stabilized some.  I have discontinued the patient's  furosemide and ACE inhibitor, and once his fluid balance improves he can  be restarted on those as needed.   #4 - CHRONIC PAIN SYNDROME.  Mr. Bolivar can be continued on his  Lyrica, methadone, and Percocet as needed.      Lonia Blood, M.D.  Electronically Signed     SL/MEDQ  D:  11/11/2006  T:  11/11/2006  Job:  [id number]   cc:   Karlene Einstein, M.D.  Anselmo Rod, M.D.

## 2010-12-15 NOTE — H&P (Signed)
NAME:  Bradley Boyle, Bradley Boyle NO.:  [id number]   MEDICAL RECORD NO.:  [id number]                   PATIENT TYPE:  INP   LOCATION:  3308                                 FACILITY:  [facility]   PHYSICIAN:  Barry Dienes. Eloise Harman, M.D.            DATE OF BIRTH:  [birth year]   DATE OF ADMISSION:  02/24/2002  DATE OF DISCHARGE:                                HISTORY & PHYSICAL   CHIEF COMPLAINT:  Dizziness associated with blood in stools.   HISTORY OF PRESENT ILLNESS:  The patient is a 77 year old white male with  multiple medical problems.  He has been a resident of the [facility] skilled nursing facility since February 2003 after a  left MCA distribution stroke.  He had complete occlusion of the left middle  cerebral artery and for this reason he has been on Coumadin since that time.  This morning, he had a moderate amount of blood mixed in his stools  associated with pallor, so he was transferred to the [facility] Emergency  Room for evaluation and management.   PAST MEDICAL HISTORY:  Significant for having had two CVA's in the left MCA  distribution resulting in expressive aphasia and right hemiparesis.  He also  has gait instability, hypothyroidism, diabetes mellitus--type 2, controlled-  -hypertension, and depression.  He has a history of osteoarthritis of the  lumbar spine.  He has had mild dysphagia but in late May 2003 he had a  modified barium swallow study that showed laryngeal penetration that  resolved with a chin tuck and no evidence of aspiration.   MEDICATIONS PRIOR TO ADMISSION:  1. Coumadin 8 mg p.o. q.d. since February 18, 2002.  2. Pamelor 75 mg p.o. q.h.s.  3. Aspirin 325 mg p.o. q.d.  4. Multivitamin 1 tablet p.o. q.d.  5. Celebrex 200 mg p.o. q.d.  6. Maxzide 25 mg p.o. q.d.  7. Avandia 8 mg p.o. q.d.  8. Paxil 20 mg p.o. q.d.  9. Cardizem CD 180 mg p.o. q.d.  10.      MiraLax 17 g p.o. q.d.   ALLERGIES:  No known drug  allergies.   PAST SURGICAL HISTORY:  No known history of surgery.   CODE STATUS:  Do Not Resuscitate.   POINT OF CONTACT:  Tyon Cerasoli (wife), telephone [phone], and Ellin Mayhew (daughter), telephone [phone] (home) and [phone] (cell).   SOCIAL HISTORY:  He is married and had lived with his wife prior to  admission to [facility].  He has ongoing tobacco use of  approximately one pack per day and no history of alcohol abuse.   FAMILY HISTORY:  Currently not obtainable.   CURRENT REVIEW OF SYSTEMS:  Significant for decreased hearing lately over  the past 2-3 weeks.  He has chronic ataxia due to right hemiparesis.  By  head nods, he indicates that he currently does not have shortness  of breath,  chest pain, abdominal pain, or constipation.  He denies current significant  depression.  At the time of my emergency room evaluation, he appeared  comfortable and indicated by head nods that he felt fine, other than  having  crampy right leg pain.   PHYSICAL EXAMINATION:  VITAL SIGNS:  Blood pressure 97/42, pulse 86,  respiratory rate 24, temperature 97.2, oxygen saturation 99% on 2 L per  minute of nasal cannula oxygen.  GENERAL:  He is an elderly, pale, white male in no apparent distress.  He  was alert and answered questions via head nods.  HEENT:  Pupils were equal, round, and reactive to light and accommodation.  Extraocular movements are intact.  Fundi showed no evidence of papilledema.  Throat was normal.  There is a moderate right facial droop.  NECK:  Supple.  There was no jugular venous distention or carotid bruits.  CHEST:  Clear to auscultation.  HEART:  Regular rate and rhythm.  S1 and S2 were present without murmur,  gallop, or rub.  ABDOMEN:  Normal bowel sounds with no hepatosplenomegaly or tenderness.  RECTAL:  Gross blood in the rectal vault.  EXTREMITIES:  Without cyanosis, clubbing, or edema.  NEUROLOGIC:  He is alert and has severe  expressive aphasia.  He shows no  spontaneous movement of the right upper extremity, which is held at 90  degree flexion.  He has 4/5 strength in the right lower extremity.   LABORATORY STUDIES:  White blood cell count 8.8, hemoglobin 5.5, hematocrit  17.1, platelet count 393.  Serum sodium 130, potassium 2.6, chloride 93, CO2  21, BUN 33, creatinine 1.4, glucose 175, serum albumin 2.8.  INR 7.1, PT  45.4.   EKG telemetry shows normal sinus rhythm.   IMPRESSION AND PLAN:  1. Gastrointestinal bleed.  This is of unclear source and aggravated by his     supratherapeutic INR.  His indication for Coumadin was total occlusion of     the left middle cerebral artery.  We will discontinue Coumadin and use     vitamin K and fresh frozen plasma to normalize his INR.  He will be given     packed red blood cells to replace the blood loss with a goal of getting     his hematocrit greater than approximately 24.  A gastroenterology     consultant has been asked to eventually evaluate him for the source of     his bleeding.  He will be put on empiric proton pump inhibitor treatment     in case the source of his bleed is from peptic ulcer disease.  2. Status post cerebrovascular accident.  He has stable severe deficits from     his left middle cerebral artery stroke.  In the future, he will be     switched to aspirin alone for stroke prophylaxis.  3. Recent hearing loss.  This is of uncertain cause and has developed with     the initiation of Pamelor treatment.  For this reason, Pamelor will be     stopped and he will be switched to Lexapro, which has a very safe side-     effect     profile, prior to his hospital discharge.  4. Hypokalemia.  This is moderately severe and due to treatment with     Maxzide.  His potassium losses will be replaced via intravenous and p.o.     route and his potassium levels will be monitored closely.  Barry Dienes Eloise Harman,  M.D.    DGP/MEDQ  D:  02/24/2002  T:  02/26/2002  Job:  [id number]   cc:   Santina Evans A. Orlin Hilding, M.D.   Charna Elizabeth, M.D.

## 2010-12-15 NOTE — Op Note (Signed)
NAME:  RUE, TINNEL NO.:  [id number]   MEDICAL RECORD NO.:  [id number]                   PATIENT TYPE:  INP   LOCATION:  3308                                 FACILITY:  [facility]   PHYSICIAN:  Charna Elizabeth, M.D.                   DATE OF BIRTH:  07/13/1930   DATE OF PROCEDURE:  02/25/2002  DATE OF DISCHARGE:  02/27/2002                                 OPERATIVE REPORT   PROCEDURE:  Colonoscopy.   ENDOSCOPIST:  Charna Elizabeth, M.D.   INSTRUMENT USED:  Olympus video colonoscope.   INDICATIONS FOR PROCEDURE:  Rectal bleeding in a 77 year old white male with  a history of a stroke and residual deficits on the right side.  The patient  has a history of Coumadin use for atrial fibrillation and presented to the  hospital with passage of red blood clots via the rectum and an elevated INR.  The patient is undergoing a screening colonoscopy to rule out colonic  polyps, masses, etc.   PREPROCEDURE PREPARATION:  Informed consent was procured from the patient's  daughter, Christean Leaf, phone number [phone] (she is power of  attorney for the patient).  The patient prepped with a gallon of NuLytely  the morning of the procedure and received four units of packed red blood  cells and four units of FFP prior to the procedure.   PREPROCEDURE PHYSICAL:  VITAL SIGNS:  The patient had stable vital signs.  He was in normal sinus rhythm with a blood pressure of 124/80, pulse of 97  per minute.  NECK:  Supple.  CHEST:  Clear to auscultation.  S1, S2 regular.  ABDOMEN:  Soft with normal bowel sounds.  NEUROLOGIC:  A dense paresis on the right side.   DESCRIPTION OF PROCEDURE:  The patient was placed in the left lateral  decubitus position and sedated with 40 mg of Demerol and 4 mg of Versed  intravenously.  Once the patient was adequately sedate and maintained on low-  flow oxygen and continuous cardiac monitoring, the Olympus video colonoscope  was advanced  from the rectum to the cecum with difficulty.  There was some  residual stool in the colon.  A few small sessile polyps were seen in the  rectum.  These were not removed considering the patient's DNR status.  The  cecal base was not visualized.  There was a significant amount of residual  stool in the cecum as well.  No large masses or polyps were seen.   IMPRESSION:  1. A few small sessile polyps in rectum, not biopsied.  2. Cecal base not visualized.  Large amount of residual stool in the     dependent areas of the colon.  3. No large masses or polyps seen.  Small lesions could have been missed.    RECOMMENDATIONS:  1. Proceed with an EGD at this time.  2. Further recommendations to be made  after EGD.                                               Charna Elizabeth, M.D.    JM/MEDQ  D:  02/25/2002  T:  03/03/2002  Job:  [id number]   cc:   Barry Dienes. Eloise Harman, M.D.

## 2010-12-15 NOTE — Consult Note (Signed)
NAME:  Bradley Boyle, Bradley Boyle NO.:  [id number]   MEDICAL RECORD NO.:  [id number]                   PATIENT TYPE:  INP   LOCATION:  3308                                 FACILITY:  [facility]   PHYSICIAN:  Inocencio Homes, MD                 DATE OF BIRTH:  10/18/1929   DATE OF CONSULTATION:  DATE OF DISCHARGE:                                   CONSULTATION   REASON FOR CONSULTATION:  Rectal bleed and severe anemia.   ASSESSMENT:  1. Anemia with hemoglobin 5.5.  2. Rectal bleeding with clots.  3. Aphasia.  4. Depression.  5. Hair loss.  6. Hypertension.  7. Diabetes.  8. Hypokalemia.  9. Hyponatremia.  10.      Status post cerebrovascular accident with left middle cerebral     artery thrombotic stroke, right hemiparesis.  11.      Hypoalbuminemia.  12.      Coagulopathy with INR 7.1.  13.      Renal insufficiency probably acute with previous baseline     creatinine 0.7 in February 2003.   RECOMMENDATIONS:  1. Transfuse 2 more units PRBC to keep hemoglobin above 9.  2. Serial H&H.  3. Transfuse FFP to reverse coagulopathy in addition to vitamin K.  4. Stabilize patient with IV fluids to keep blood pressure at least about     100 systolic.  5. For colonoscopy when patient is stable.   HISTORY OF PRESENT ILLNESS:  This is a 77 year old white male with multiple  problems including left MCA embolism in February 2003 with residual right  hemiparesis who has been at extended care and rehabilitation in and out  since February 2003.  He was brought into the ER today secondary to feeling  weak and dizzy and bright red blood per rectum noted by the nurses in  extended care last night as well as this morning.  He had some clots noted  also this morning.  The patient is aphasic, but responds to voice and  command and history is essentially obtained through the daughter who was at  bed side.  Per daughter, no previous history of GI bleed or colonoscopy.  The  patient has a fairly good quality of life prior to this CVA in February  and continues to improve.  The patient denies any evident abdominal pain,  nausea, or vomiting and denies constipation or diarrhea.  No apparent melena  stools prior to this episode of bright red blood per rectum.   PAST MEDICAL HISTORY:  As above.   ALLERGIES:  No known drug allergies.   MEDICATIONS:  1. Paxil 20 mg p.o. q.d.  2. Hydrochlorothiazide 25 mg p.o. q.d.  3. Avandia 8 mg p.o. q.d.  4. Vioxx 25 mg p.o. q.d.  5. Cardizem CD 180 mg q.d.  6. Coumadin 5 mg p.o. q.d. adjusted per INR.  7. Nortriptyline 75 mg q.h.s.  SOCIAL HISTORY:  The patient has been in extended care and rehabilitation  since February 2003.  He previously lived at home with his wife.  No current  tobacco, alcohol, or drug abuse.  The patient has a residual right  hemiparesis and aphasia and so he is disabled.   FAMILY HISTORY:  Per daughter there is no positive history of colon cancer,  diverticulitis, or polyps.  Positive family history of diabetes and  hypertension.   REVIEW OF SYMPTOMS:  Unobtainable at this point.   PHYSICAL EXAMINATION:  VITAL SIGNS:  Temperature 98.4, blood pressure 109/58  initially, dropped to 97/42, and now 123/60, pulse initially 103, but now  86, respirations 24, O2 saturation 99% on room air.  GENERAL:  He is an aphasic, but responsive man in no acute distress.  HEENT:  He is edentulous with mild pallor.  No jaundice.  PERRL and EOMI.  NECK:  Supple.  No JVD.  No lymphadenopathy.  CHEST:  Clear to auscultation bilaterally and symmetrical.  CARDIOVASCULAR:  He had distant heart sounds but they are regular rate and  rhythm.  ABDOMEN:  Soft, scaphoid with pulsatile aorta.  Questionable aneurysm, but  nontender.  Positive bowel sounds.  EXTREMITIES:  No clubbing, cyanosis, edema.  NEUROLOGIC:  He is alert, aphasic with right hemiparesis.   LABORATORIES:  White count 8.8, hemoglobin 5.5, platelets  395,000.  Sodium  130, potassium 2.6, chloride 93, CO2 21, BUN 33, creatinine 1.4, glucose  175, calcium 8.6.  He has an MCV of 83.6 and ALT of 7.  Alkaline phosphatase  is 82, AST 24, ALT 14, total protein 6.1, albumin 2.8, total bilirubin 0.4.  PTT 85, PT 45.4, INR 7.1.   Chest x-ray shows basilar atelectasis.   IMPRESSION:  This is a 77 year old white male status post left middle  cerebral artery cerebrovascular accident in extended care coming with severe  anemia, rectal bleed, and coagulopathy.  The patient has been on Coumadin  therapy for his left internal carotid artery stenosis since February.  His  INR is now very high at 7.1 which might be responsible for his bleeding.  He  is also in the right age for colon cancer as well  as other causes of bright  red bleeding per rectum including diverticular disease, polyps, internal  hemorrhoids, etc.  The patient will be admitted for colonoscopy with or  without biopsy once he is stable.  At this point, however, patient's  coagulopathy needs to be reversed and hemoglobin raised to more than 9  before gastrointestinal intervention.  If patient continues to drop his  hemoglobin or active bleeding returns, we will do an emergent colonoscopy  then.     Lonia Blood, M.D.                         Inocencio Homes, MD    LG/MEDQ  D:  02/24/2002  T:  02/26/2002  Job:  [id number]   cc:   Gwen Pounds, M.D.

## 2010-12-15 NOTE — H&P (Signed)
[place]. [facility]  Patient:    Bradley Boyle, Bradley Boyle Visit Number: [id number] MRN: [id number]          Service Type: MED Location: [phone] Attending Physician:  Durel Salts Dictated by:   Gustavus Messing Orlin Hilding, M.D. Admit Date:  09/06/2001   CC:         [name], M.D.   History and Physical  CHIEF COMPLAINT:  Falls, abnormal speech.  HISTORY OF PRESENT ILLNESS:  History obtained from chart; patient is unable to give history secondary to aphasia.  He was brought in by EMS, who were called after patient fell today.  Wife gave conflicting information to EMS, did not come to hospital and does not answer her phone.  Patient reportedly had previous stroke a few years ago from which he recovered.  Sometime yesterday, he had onset of new right-sided hemiparesis and speech disturbance and then fell today.  REVIEW OF SYSTEMS:  He denies chest pain, shortness of breath or headache.  PAST MEDICAL HISTORY:  According to the chart, he has had elevated TSH, hypercholesterolemia, history of, I guess, depression, adult-onset diabetes and hypertension by his other medications.  MEDICATIONS:  He takes Cartia, diclofenac, Paxil, propranolol, hydrochlorothiazide and Avandia and recently was on a prednisone taper.  ALLERGIES:  No known drug allergies.  SOCIAL HISTORY:  He is married.  Question if he is a smoker; he has got nicotine stains on his finger.  FAMILY HISTORY:  Unobtainable.  PHYSICAL EXAMINATION:  VITAL SIGNS:  Temperature is 98.9, pulse 99, respirations 20, blood pressure 161/75.  HEENT:  Head normocephalic, atraumatic.  NECK:  Supple without bruits.  HEART:  Regular rate and rhythm.  LUNGS:  Clear to auscultation.  EXTREMITIES:  Without edema but he has got abrasions and ecchymoses on his knees and elbows.  NEUROLOGIC:  Mental status:  He is awake and alert and aphasic, says "yep" and "no" with probably about 75%  accuracy.  He cannot name or repeat.  He does follow some commands on the left.  Cranial nerves:  Pupils appear to be equal and reactive.  Visual fields appear to be full.  Extraocular movements are intact.  Facial sensation appears to be normal.  Facial motor activity shows a right central facial droop.  Hearing is intact.  Palate is symmetric and tongue is midline.  Motor exam:  He has got a dense right hemiparesis, normal strength on the left.  Reflexes are brisk on the right, relatively speaking, and decreased on the left.  Downgoing toes.  Coordination:  Cannot understand to follow.  He has got some arthritic-appearing changes in his hands.  Sensory may be decreased to the right lower extremity, it is hard to tell because of the language problem.  LABORATORY AND ACCESSORY DATA:  CT scan of the brain shows what looks like a subacute left middle cerebral artery infarct in the peri-insular region.  Sodium is 129, potassium 3.3, chloride 96, CO2 25, BUN 15, creatinine 0.9, glucose 158.  IMPRESSION:  Left middle cerebral artery stroke -- looks thromboembolic -- with a dense right hemiparesis and aphasia.  PLAN:  Admit for heparin IV, stroke workup with MRI, MR angiogram, carotid ultrasound, 2-D echo and therapies as well as rehab. Dictated by:   Gustavus Messing Orlin Hilding, M.D. Attending Physician:  Durel Salts DD:  09/06/01 TD:  09/07/01 Job: [id number]

## 2010-12-15 NOTE — Discharge Summary (Signed)
[place]. [facility]  Patient:    Bradley Boyle, Bradley Boyle Visit Number: [id number] MRN: [id number]          Service Type: MED Location: [phone] Attending Physician:  Durel Salts Dictated by:   Kelli Hope, M.D. Admit Date:  09/06/2001 Disc. Date: 09/18/01   CC:         Bradley Boyle, M.D.   Discharge Summary  ADMISSION DIAGNOSIS:  Acute left middle cerebral artery stroke with dense right hemiparesis and aphasia.  DISCHARGE DIAGNOSES: 1. 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 2. Left internal carotid artery occlusion requiring temporary chronic    anticoagulation for stump emboli. 3. Hypertension. 4. Diabetes mellitus. 5. History of depression.  CONDITION ON DISCHARGE:  Good.  DIET:  Dysphagia I (pureed) with honey-thick liquids.  ACTIVITY:  Out of bed as tolerated.  DISCHARGE MEDICATIONS: 1. Paxil 20 mg p.o. q.d. 2. Hydrochlorothiazide 25 mg p.o. q.d. 3. Avandia 8 mg p.o. q.d. 4. Vioxx 25 mg p.o. q.d. 5. Cardizem 60 mg p.o. t.i.d. 6. Coumadin 5 mg p.o. q.d., dose adjusted as needed, x 3 months, then okay    to step down to aspirin one p.o. q.d.  STUDIES: 1. CT of the head performed September 06, 2001, revealing subacute bland infarct    of he left MCA distribution affecting the left temporal and parietal    lobes. 2. Plain x-ray of the knee September 06, 2001, revealing degenerative changes    but no fracture or subluxation. 3. MRI of the brain performed September 07, 2001, revealing extensive left    middle cerebral artery territory acute stroke. 4. MR angiography of the neck vessels revealing occlusion of the internal    carotid artery at its origin, 30-40% narrowing of the right internal    carotid artery. 5. Chest x-ray performed September 07, 2001, bilateral lower lobe atelectasis. 6. Modified barium swallow September 09, 2001, with delayed  initiation of    swallowing with residue in the hypopharynx with aspiration of nectar and    thin consistencies. 7. Carotid Doppler study performed September 08, 2001, confirming distal    occlusion of the left internal carotid artery. 8. Transthoracic echocardiogram performed September 08, 2001, revealing normal    LV function and ventricular ejection fraction of 55-65%. 9. Echocardiogram September 06, 2001, with left axis and low voltage but no    acute changes.  LABORATORY DATA:  Repeated CBCs remarkable for mild anemia with hemoglobin in the 12-13 range, MCV approximately 98.  Routine chemistries remarkable for slightly elevated glucose.  Lipid panel normal.  INR of 2.7 on discharge.  HOSPITAL COURSE:  Please see admission H&P by Bradley Boyle. Bradley Boyle, M.D., for full admission details.  Briefly, this is a 77 year old male with the aforementioned medical problems who presented to the emergency room after the patient was found by EMS after a fall.  He was found to have a stroke on admission CT scanning and was subsequently admitted for further evaluation and workup.  Initially placed on heparin and was switched to Coumadin over concern of stump emboli, as he was found to have a left ICA occlusion.  Routine workup was undertaken with the above results.  Speech therapy, physical therapy, and occupational therapy were consulted and assisted in the patients rehabilitation.  He was found to have fairly significant dysphagia but did tolerate a D-I diet with honey-thin liquids.  Rehab was consulted and felt that he  was an appropriate candidate; however, physical evaluation raised a question of his ability to function at home, as his wife has some significant health problems including psychiatric comorbidities, and ultimately after discussion between case management and the patients daughter, it was felt that nursing home placement would be arranged.  Accordingly, arrangements were made for  transfer to [facility].  The patient reached a therapeutic INR on February 18.  Heparin was discontinued on February 19.  He was deemed stable for discharge to nursing home on February 20.  DISPOSITION:  He will be followed at the nursing home p.r.n.  He will have an INR check on February 24 and subjacent adjustment to Coumadin made at that time.  As noted above, Coumadin is to be continued for at least three months, then may be discontinued and switched to aspirin therapy. Dictated by:   Kelli Hope, M.D. Attending Physician:  Durel Salts DD:  09/18/01 TD:  09/18/01 Job: [id number]

## 2010-12-15 NOTE — Discharge Summary (Signed)
[place]. [facility]  Patient:    Bradley Boyle, Bradley Boyle                     MRN: [id number] Adm. Date:  02/28/01 Disc. Date: 03/03/01 Attending:  Luis Abed, M.D. [facility] Dictator:   Gene Serpe, P.A. CC:         Bradley Boyle, M.D.   Referring Physician Discharge [place]  PROCEDURES:  Adenosine Cardiolite, March 03, 2001.  REASON FOR ADMISSION:  The patient is a 77 year old male with no prior history of ischemic heart disease, but with a remote history of supraventricular tachycardia with cardiac risk factors notable for hypertension.  He presented with tachycardia associated with dizziness.  He was admitted for family members and diagnostic evaluation.  LABORATORY DATA:  Cardiac enzymes:  CPK 95/3.8 and 114/4.6; troponin I 0.38 and 0.31.  Lipid profile:  Total cholesterol 141, triglycerides 314, HDL 23, LDL 55.  Elevated TSH 7.33.  Admission metabolic profile:  Sodium 131, potassium 3.9, glucose 199, BUN 22, creatinine 1.3.  Follow-up potassium low at 2.7; 4.1 at discharge.  Elevated hemoglobin A1C at 8.8.  WBC 7.4, HGB 14.8, HCT 42.4, platelets 167 on admission.  D-dimer 0.71.  Admission CXR:  No pulmonary edema; ectatic thoracic aorta.  HOSPITAL COURSE:  The patient was placed on short-acting diltiazem for management of the SVT.  The initial heart rate was 150 on presentation. Luis Abed, M.D., felt this either represented atrial flutter or AVNRT. Of note, the rhythm strips documenting the break in the rhythm were not available.  A subsequent EKG revealed NSR at 60 bpm with left axis deviation.  The TSH was mildly elevated.  Cardiac enzymes showed no significant increase in CPK, but the troponin I was mildly elevated (0.38).  The patient was referred for pharmacologic Cardiolite stress testing and this was interpreted as normal perfusion scan; EF 71%.  The patient was also found to have developed hypokalemia following an initial admission  level of 3.9.  He has a history of hypertension and was on ______ on admission.  Following repletion, this restored the level to 4.1 at discharge.  He will need a follow-up BMP in one week.  The patient also developed hyponatremia (125), but this restored to a normal level of 134 at the time of discharge.  Review of telemetry strips revealed one episode of junctional rhythm with a rate of approximately 40 bpm.  Subsequent strips revealed rates in the 50-75 range.  The patient was tolerating his short-acting diltiazem by the time of discharge and this was consolidated with long-acting Cardizem.  Luis Abed, M.D., indicated that if the patient were to have recurrent SVT, consideration would be given to radiofrequency catheter ablation.  Additional issues by Luis Abed, M.D.:  Elevated hemoglobin A1C suggestive of new onset diabetes mellitus.  This, in addition to the mildly elevated TSH, will need to be followed up by the patients primary care physician for further evaluation and management.  Luis Abed, M.D., also noted a mildly elevated D-dimer, but found no evidence of pulmonary embolus and suggested no further work-up.  MEDICATIONS AT DISCHARGE: 1. Inderide 80/25 mg q.d. 2. Cardizem CD 240 mg q.d. 3. Aspirin 81 mg q.d. 4. Paxil 20 mg q.d.  FOLLOW-UP:  The patient is scheduled to follow up with Luis Abed, M.D., on Wednesday, April 02, 2001 at 12:15 p.m.  The patient is also instructed to schedule a follow-up appointment with his primary care  physician, Bradley Boyle, M.D., in the next two weeks.  He will need a follow-up metabolic profile at that time.  DISCHARGE DIAGNOSES: 1. Recurrent supraventricular tachycardia.    a. Question atrioventricular nodal reentrant tachycardia versus atrial       flutter. 2. Mildly elevated troponins.    a. Normal adenosine Cardiolite. 3. Hyperglycemia.    a. Question new onset diabetes mellitus. 4. Mildly  elevated thyroid stimulating hormone. 5. History of hypertension. 6. Dyslipidemia.    a. Elevated triglycerides/low low-density lipoprotein. 7. Hypokalemia. 8. History of tobacco. DD:  03/03/01 TD:  03/03/01 Job: [id number]

## 2010-12-15 NOTE — Op Note (Signed)
NAME:  Bradley Boyle, Bradley Boyle NO.:  [id number]   MEDICAL RECORD NO.:  [id number]                   PATIENT TYPE:  INP   LOCATION:  3308                                 FACILITY:  [facility]   PHYSICIAN:  Charna Elizabeth, M.D.                   DATE OF BIRTH:  August 28, 1929   DATE OF PROCEDURE:  02/25/2002  DATE OF DISCHARGE:  02/27/2002                                 OPERATIVE REPORT   PROCEDURE PERFORMED:  Esophagogastroduodenoscopy with biopsies.   ENDOSCOPIST:  Charna Elizabeth, M.D.   INSTRUMENT USED:  Olympus video panendoscope.   INDICATIONS FOR PROCEDURE:  The patient is a 77 year old white male with a  history of a stroke, underwent a colonoscopy for rectal bleeding.  No  definite source of bleeding was identified.  Therefore an EGD is being done  to rule out peptic ulcer disease, arteriovenous malformations, etc.   PREPROCEDURE PREPARATION:  Informed consent was procured from the patient.  The patient was fasted for four hours prior to the procedure.   PREPROCEDURE PHYSICAL:  The patient had stable vital signs.  Neck was  supple.  Chest clear to auscultation.  Abdomen soft with normal bowel  sounds.   DESCRIPTION OF PROCEDURE:  The patient was placed in left lateral decubitus.  He had received Demerol and Versed for the colonoscopy and therefore, no  additional sedation was given.  Once the patient was adequately positioned,  the Olympus video panendoscope was advanced through the mouthpiece over the  tongue into the esophagus under direct vision.  The entire esophagus  appeared normal with no evidence of ring, stricture, masses, esophagitis or  Barrett's mucosa.  The scope was then advanced into the stomach.  There was  atrophic gastritis throughout the gastric mucosa.  Biopsies were done from  the antrum to rule out presence of Helicobacter pylori by pathology.  A  large duodenal ulcer was seen in the duodenal bulb.  The small bowel distal  to the bulb  appeared normal.  There was no evidence of visible vessel.   IMPRESSION:  1. Normal-appearing esophagus.  2. Atrophic gastritis, biopsies done from the antrum to rule out presence of     Helicobacter pylori by pathology.  3. Large duodenal ulcer in duodenal bulb without visible vessel.  4. Normal proximal small bowel.   RECOMMENDATIONS:  1. Avoid all nonsteroidals including aspirin.  2. Continue proton pump inhibitor.  3. Hold on Coumadin for now.  4. Continue serial CBCs and transfuse as needed to keep the hematocrit above     25%.                                              Charna Elizabeth, M.D.   JM/MEDQ  D:  02/25/2002  T:  03/03/2002  Job:  [id number]

## 2010-12-15 NOTE — Consult Note (Signed)
NAME:  Bradley Boyle, Bradley Boyle NO.:  [id number]   MEDICAL RECORD NO.:  [id number]          PATIENT TYPE:  REC   LOCATION:  FOOT                         FACILITY[facility]   PHYSICIAN:  Jonelle Sports. Sevier, M.D. DATE OF BIRTH:  1929-10-08   DATE OF CONSULTATION:  04/27/2004  DATE OF DISCHARGE:                                   CONSULTATION   REASON FOR CONSULTATION:  This 77 year old white male was referred to the  courtesy of Dr. Fleet Contras for assistance with management of a lesion of  the right foot.   HISTORY OF PRESENT ILLNESS:  The patient has had type 2 diabetes for an  undeterminate period of time (he has a great deal of expressive aphasia and  is unable to give very satisfactory history, so some details are sketchy),  but is in apparently reasonably good control with an A1C of 7.2% about one  month ago.  Since mid-August of this year, he has had an ulcer on the right  dorsal forefoot just proximal to the area of the first metatarsal head.  It  is unclear whether this was rubbed by footwear or maybe struck on a  wheelchair or whatever, in that the patient is hemiplegic in this extremity  secondary to an old CVA.  Whatever, he was given a course of antibiotics  which has since been completed and daily dressing changes to the wound using  presumably something on the order of Neosporin.   Apparently, it was never been thought to be deep infection nor has he had  any systemic symptoms associated with this problem.  He is here now for our  evaluation and advise and continued care as necessary.   PAST MEDICAL HISTORY:  1.  Hypertension.  2.  Ongoing tobacco abuse.  3.  Peptic ulcer disease.  4.  Coronary stenosis.  5.  Chronic anemia.  6.  Depression.  7.  Cerebrovascular accident some three and a half years ago associated with      expressive aphasia and right hemiplegia.   ALLERGIES:  He has no known medicinal allergies.   MEDICATIONS:  Avandia, Prilosec,  Cardizem CD, aspirin, Baclofen,  hydrocodone/APAP, Nortriptyline, Ativan, Albuterol inhaler, multivitamin,  iron, and Senna.   PHYSICAL EXAMINATION:  GENERAL:  Examination today is limited to distal  lower extremities.  EXTREMITIES:  The right lower extremity is considerably edematous as  compared to minimal edema on the contralateral side.  This appears to be  benign in nature in association with a hemiplegic extremity and I do not  detect evidence suspicious for venous thrombosis or the like.  Skin  temperature in that extremity is somewhat diminished.  Pulses are not  palpable through the edema and on doppler determination are monophasic at  the dorsal pedis position and unobtainable with certainty at the posterior  tibial position.  Despite this obvious significant peripheral vascular  disease, there is no evidence for imminent ischemia to the forefoot.  Monofilament testing shows that he has protected sensation in that foot.  The plantar aspect of the foot is free of calluses or other significant  lesions.  There is some dryness of the skin at the heels.  On the dorsum of  the right foot just proximal to the head of the first metatarsal is a lesion  covered by eschar measuring some 4 by 8 mm with no surrounding erythema and  no evidence of deep infection.   IMPRESSION:  1.  Probable traumatic lesion (perhaps rubbed by footwear) of the right      forefoot, slow to heal due to significant peripheral arterial disease.  2.  Tobacco abuse.  3.  Diabetes mellitus type 2 in fair control.   DISPOSITION:  1.  No attempt is made to give the patient particular instructions regarding      foot care and diabetes because of his difficulty in understanding and      his inability to really care for his own feet.  2.  It is suggested that the patient needs to consider abandoning his      smoking but I think that will be a difficult issue for [place] or any other      physician involved in his  care.  3.  The eschar from the lesion of the dorsum of the right foot is cautiously      debrided and the lesion appears to be fully healed underneath.  4.  It is treated today with an application of Neosporin and this is covered      with an alleviant pad, the latter for protection against new trauma.  It      is noted that the patient is wearing a soft slipper on that foot and      recommendation is made to the referring facility to maintain him in that      type of foot wear with his degree of edema likely to generate pressure      concerns and ischemic concerns in the ordinary footwear.  5.  It is also recommended to the receiving facility that they simply      cleanse this area daily and keep some protective pad such as an      alleviant pad over the area until it is fully healed.  6.  The patient is given nail care here today by the nurse and arrangements      will be made for him to return here every three months for routine nail      care.  7.  Otherwise return may be on a p.r.n. basis.       RES/MEDQ  D:  04/27/2004  T:  04/28/2004  Job:  [id number]   cc:   Fleet Contras, M.D.  [address]  [place]  [place] [address]  Fax: [phone]

## 2010-12-15 NOTE — H&P (Signed)
NAME:  Bradley Boyle, Bradley Boyle NO.:  [id number]   MEDICAL RECORD NO.:  [id number]                   PATIENT TYPE:  INP   LOCATION:  3308                                 FACILITY:  [facility]   PHYSICIAN:  Barry Dienes. Eloise Harman, M.D.            DATE OF BIRTH:  [birth year]   DATE OF PROCEDURE:  DATE OF DISCHARGE:  02/26/2002                      STAT - MUST CHANGE TO CORRECT WORK TYPE   HISTORY OF PRESENT ILLNESS:  The patient is a 77 year old white male with  multiple medical problems who has been a resident of the [facility] since approximately 2/03 after a left middle cerebral artery  stroke.  On the morning of admission, he had a moderate amount of blood  mixed in his stools associated with pallor and hypotension.  He was  transferred to the [facility] Emergency Room for evaluation and management.  After IV fluids were administered and packed red cell transfusions were  begun, he felt comfortable as indicated by head nods.  He has expressive  aphagia due to his stroke.   PAST MEDICAL HISTORY:  Otherwise, significant for two strokes with resultant  expressive aphagia and right hemiparesis.  He has gait instability, diabetes  mellitus-type II which is well controlled, hypothyroidism, hypertension,  depression and anti-coagulation.   MEDICATIONS:  1. Coumadin 8 mg p.o. daily.  2. Pamelor 75 mg p.o. q.h.s.  3. Aspirin 325 mg p.o. daily.  4. Multivitamins p.o. daily.  5. Celebrex 200 mg p.o. daily.  6. Maxzide 25 mg p.o. daily.  7. Avandia 8 mg p.o. daily.  8. Paxil 20 mg p.o. daily.  9. Cardizem 180 mg p.o. daily.  10.      MiraLax 17 g daily.   CODE STATUS:  His code status is "Do Not Resuscitate."   POINT OF CONTACT:  Jameire Kouba (wife), [phone] and Ellin Mayhew  (daughter), [phone].   REVIEW OF SYSTEMS:  Significant for chronic ataxia due to right hemiparesis  and marked decrease in his hearing over the past few weeks.   He denied  shortness of breath, chest pain, abdominal pain, constipation or depression.   PHYSICAL EXAMINATION:   VITAL SIGNS:  Blood pressure 97/42, pulse 86, respiratory rate 24,  temperature 97.2.   GENERAL APPEARANCE:  He is an elderly white male who initially was pale and  in no apparent distress.  He was alert and answered questions via head nods.   HEENT:  Pupils equal, round and reactive to light and accomodation.  Extraocular muscles intact.  Fundi were benign.  He has a right facial  droop.  Throat was normal.   NECK:  Supple.  There was no jugular venous distention or carotid bruit.   CHEST:  Clear to auscultation.   HEART:  Regular rate and rhythm.  S1 and S2 present without murmurs, rubs or  gallops.   ABDOMEN:  Normal bowel sounds with no hepatosplenomegaly or tenderness.   RECTAL:  Gross maroon blood in the rectal vault.   EXTREMITIES:  Without cyanosis, clubbing or edema.   NEUROLOGIC:  He was alert and has severe expressive aphagia with no  spontaneous movement of the right upper extremity and 4/5 right lower  extremity strength.  His external auditory canals were normal and the  appearance of the tympanic membranes was normal.   LABORATORY DATA:  Initial labs showed a white blood cell count of 8.8,  hemoglobin 5.5, hematocrit 17.1, platelet 393, serum sodium 130, potassium  2.6, chloride 93, CO2 21, BUN 33, creatinine 1.4, glucose 175, serum albumin  2.8, INR 7.1, PT 45.4.   On the day of discharge, his white blood cell count was 6.8, hemoglobin 9.3,  hematocrit 27.9, platelet count 222.  Serum sodium was 135, potassium 4.2,  chloride 102, CO2 26, BUN 7, creatinine 0.8, glucose 73.   HOSPITAL COURSE:  The patient was admitted to a stepdown bed with telemetry.  He was given transfusions of four units of packed red blood cells with four  units of fresh frozen plasma and high dose vitamin K intravenously.  Overnight, his INR normalized and his rectal  bleeding resolved.  On 7/30, he  had a EGD and colonoscopy performed by Dr. Loreta Ave which revealed the  following:  1. A small polyp at the rectum.  2. Cecum was not visualized due to technical difficulties.  3. Large duodenal ulcer without a visible vessel or signs of active     bleeding.  4. There was also evidence of atrophic gastritis.   Helicobacter pyloris biopsies were obtained, results of which were pending  at the time of dictation.  His serum potassium level normalized with high  dose intravenous and p.o. potassium chloride replacements.   COMPLICATIONS:  None.   CONDITION ON DISCHARGE:  His blood pressure on the day of discharge was  112/60 with pulse of 82.  He was afebrile without any shortness of breath or  abdominal discomfort.  He had not had any rectal bleeding for over 24 hours  and denied abdominal pain.   DISCHARGE DIAGNOSES:  1. Upper GI bleed due to duodenal ulcer.  2. Anemia.  3. Hypokalemia.  4. Cerebrovascular disease status post left MCA stroke.  5. Osteoarthritis.  6. Hypertension.  7. Depression.  8. Malnutrition.  9. Hypothyroidism.   DISCHARGE MEDICATIONS:  1. Avandia 8 mg p.o. daily.  2. Multivitamin one tablet p.o. daily.  3. Health shakes three times daily.  4. Vicodin 5/500 one tablet p.o. q.i.d. p.r.n. pain.  5. MiraLax 17 g p.o. daily.  6. Cardizem CD 180 mg p.o. daily.  7. Lexapro 10 mg p.o. daily.  8. Ecotrin 325 mg p.o. daily, should be restarted in 14 days following     discharge.  9. Protonix 40 mg p.o. b.i.d. x 14 days then one tablet p.o. daily.  10.      Amoxicillin 250 mg p.o. t.i.d. x14 days.  11.      Biaxin 500 mg p.o. b.i.d. x 14 days.   DIET:  Regular diet with chin tuck.   FOLLOWUP:  He will be transferred to a skilled nursing facility for  continued care.                                                Barry Dienes Eloise Harman, M.D.   DGP/MEDQ  D:  02/26/2002  T:  02/26/2002  Job:  [id number]   cc:   Charna Elizabeth, M.D.    Catherine A. Orlin Hilding, M.D.

## 2010-12-15 NOTE — Op Note (Signed)
NAME:  Bradley Boyle, Bradley Boyle NO.:  [id number]   MEDICAL RECORD NO.:  [id number]          PATIENT TYPE:  INP   LOCATION:  2926                         FACILITY:  [facility]   PHYSICIAN:  Anselmo Rod, M.D.  DATE OF BIRTH:  May 21, 1930   DATE OF PROCEDURE:  11/11/2006  DATE OF DISCHARGE:                               OPERATIVE REPORT   PROCEDURE PERFORMED:  Esophagogastroduodenoscopy.   SURGEON:  Anselmo Rod, M.D.   INSTRUMENT USED:  Pentax video panendoscope.   INDICATIONS FOR PROCEDURE:  A 77 year old white male with multiple  medical problems.  The patient is a DNR undergoing an EGD for severe  drop in hemoglobin to 11.2.  The patient has a history of duodenal ulcer  in the past, rule out recurrent ulcer disease, preprocedure perforation.  Informed consent obtained from the patient and the patient fasted for 4  hours prior to the procedure.  The risks and benefits of the procedure  were discussed with the patient's daughter, who is Power of [place] for  the patient.   PREPROCEDURE PHYSICAL:  The patient with stable vital signs.  Neck  supple. Chest clear to auscultation.  S1 and S2 regular.  Abdomen soft  with normal bowel sounds.   DESCRIPTION OF PROCEDURE:  The patient was placed in the left lateral  decubitus position and sedated with 20 mcg Fentanyl and 2 mg of Versed  given intravenously in slow incremental doses. Once the patient was  adequately sedated and maintained on low flow oxygen and continuous  cardiac monitoring. The Pentax video panendoscope was advanced through  the mouth piece over the tongue into the esophagus under direct vision.  The entire esophagus was widely patent with no evidence of residual  esophagitis or Barrett's mucosa.  The scope was then advanced into the  stomach.  There was mild diffuse gastritis noted throughout the gastric  mucosa with areas of more severe inflammation noted in patches.  A large  AVM was noted in the  proximal stomach and a small AVM noted in the  proximal small bowel.  No ulcers or erosions, masses or polyps were  noted and the proximal small bowel appeared normal except for the small  AVM mentioned above.   IMPRESSION:  1. Normal esophagus.  2. 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  3. Small arteriovenous malformation in the proximal small bowel.  4. No ulcers or masses seen.   RECOMMENDATIONS:  1. No intervention will be undertaken as these AVM's are not bleeding      at the present time. There is a high incidence of re-bleed with      such lesions. I have discussed the patient's case with Dr. Lonia Blood and if the patient should re-bleed, decision will be made      depending on the patient's daughter's decision to resuscitate the      patient.  2. It would help to take the patient off the aspirin.  3. Outpatient followup is advised as needed in the future.  Anselmo Rod, M.D.  Electronically Signed     JNM/MEDQ  D:  11/11/2006  T:  11/12/2006  Job:  [id number]

## 2010-12-15 NOTE — H&P (Signed)
NAME:  Bradley Boyle, Bradley Boyle NO.:  [id number]   MEDICAL RECORD NO.:  [id number]          PATIENT TYPE:  INP   LOCATION:  2313                         FACILITY:  [facility]   PHYSICIAN:  Lonia Blood, M.D.       DATE OF BIRTH:  1929/10/13   DATE OF ADMISSION:  11/10/2006  DATE OF DISCHARGE:                              HISTORY & PHYSICAL   PRIMARY CARE PHYSICIAN:  Karlene Einstein, M.D.   PRIMARY GASTROENTEROLOGIST:  Anselmo Rod, M.D.   CHIEF COMPLAINT:  Altered mental status.   HISTORY OF PRESENT ILLNESS:  Mr. Blackburn is a 77 year old gentleman  who resides in a skilled nursing facility after he suffered a disabling  stroke in 2003, with a history of upper gastrointestinal bleeding  secondary to duodenal ulcer in 2003, on chronic aspirin to prevent  recurrent strokes, who was transferred from the skilled nursing facility  today after he was found unresponsive, hypotensive and after having  repeat tarry black bowel movements.  The patient himself is unable to  provide full and complete history as he is barely arousable during my  examination.  According to the records in the emergency room, the  patient's blood pressure was systolic of 60 with palpation only when the  EMS arrived at the facility.  The patient was bolused with normal saline  and was transferred emergently to [facility] ER.   PAST MEDICAL HISTORY:  1. Left MCA cerebrovascular accident 2003 with resultant aphasia and      right hemiparesis.  2. Hypertension.  3. Diabetes mellitus, type 2.  4. Hypothyroidism.  5. Peptic ulcer disease with duodenal ulcer bleed in 2003.  6. Depression.  7. Neuropathic pain on chronic methadone.  8. Gastroesophageal reflux disease.  9. Moderate protein caloric malnutrition.   MEDICATIONS AT THE SKILLED NURSING FACILITY.:  1. Metformin 500 mg twice a day.  2. Lotensin 5 mg daily.  3. Aspirin 325 mg daily.  4. Baclofen 10 mg twice a day.  5. Cardizem CD 180 mg  daily.  6. Senokot twice a day.  7. Multivitamin daily.  8 . Iron 325 daily.  1. Potassium chloride 20 mEq daily.  2. Lasix 40 mg daily.  3. Prilosec 20 mg daily.  4. Cymbalta 90 mg daily.  5. Lyrica 75 mg three times a day.  6. Methadone 10 mg daily.  7. Simvastatin 20 mg daily.  8. Percocet as needed.  9. Ambien 5 mg at bedtime.   SOCIAL HISTORY:  The patient currently does smoke cigarettes, does not  drink alcohol.  He is a retired Radiation protection practitioner. He is a Do Not Resuscitate.  His healthcare power of attorney is his daughter, Ms.  Schoolfield,  phone number [phone]   FAMILY HISTORY:  Unobtainable.   REVIEW OF SYSTEMS:  Unobtainable.   PHYSICAL EXAMINATION:  VITAL SIGNS:  On admission, the patient's blood  pressure is 78/48, pulse 80, respirations about 10, saturation 97% on 2  liters on oxygen.  GENERAL APPEARANCE:  Lethargic, pale, well-developed gentleman in no  acute distress lying on the stretcher.  He is very difficult to arouse.  Upon arousal, he has got expressive aphasia.  He will follow some  commands.  Overall looks stuporous and unable to fully understand and  perform tasks.  HEENT:  Head appears normocephalic, atraumatic. Eyes:  Pupils equal and  round, react to light and accommodation.  Extraocular movements intact.  Throat clear.  NECK: Supple.  No JVD, carotid bruits.  CHEST:  Very poor effort but clear to auscultation.  HEART: Distant sounds, regular without murmurs, rubs or gallops.  ABDOMEN:  Soft, nontender, nondistended.  Bowel sounds are decreased.  EXTREMITIES:  Lower extremities have +2 bilateral edema.  Skin is very  pale, cold and clammy.  Distal pulses are present bilaterally.  NEUROLOGIC:  Exam unable to perform, but the patient seems to be moving  the left upper extremity without major difficulty.  There is a  contracture in the right upper extremity in flexion and without any  signs of movement of the right upper extremity. Deep tendon reflexes  are  but accentuated on the right and +1 on the left.   LABORATORY VALUES AT TIME OF ADMISSION:  Sodium 148, potassium 4.4,  chloride 113, bicarb 26, BUN 113, creatinine 1.6, glucose 112, albumin  2.9, alkaline phosphatase 111, total bilirubin 0.5, AST 19, ALT 16.  Prothrombin time is 15.3.  CBC shows white blood cell count 12.3,  hemoglobin 6.5, a platelet count of 205. Fecal occult blood is positive.  Urinalysis shows positive nitrite and leukocyte esterase with a  microscopy of many bacteria.   EKG shows normal sinus rhythm with low-voltage QRS, difficult to  interpret. No major ST-T changes that I can find.   ASSESSMENT/PLAN:  1. Hypovolemia hemorrhagic shock most likely secondary to recurrent      upper gastrointestinal bleeding, most likely from peptic ulcer      disease.  At this point in time, plan to admit the patient, place      him on proton pump inhibitor, transfuse him 4 units packed red      blood cells with Lasix in between the units to prevent volume      overload.  Gastroenterology consultation has been obtained      emergently with Dr. Christella Hartigan who was present here in the emergency      room.  We will hold all his oral antihypertensives and pain      medication until he stabilizes      further.  2. Possible urinary tract infection.  The patient's urine will be sent      for culture, and he will be treated with antibiotics as needed.      Lonia Blood, M.D.  Electronically Signed     SL/MEDQ  D:  11/10/2006  T:  11/10/2006  Job:  [id number]   cc:   Karlene Einstein, M.D.

## 2010-12-17 IMAGING — CR DG CHEST 2V
1 series · 1 of 1 positions shown · non-contrast
Comparison: 12/31/2008

CLINICAL DATA: Sepsis.  Health care acquired pneumonia

CHEST - 2 VIEW

[w chest lat]
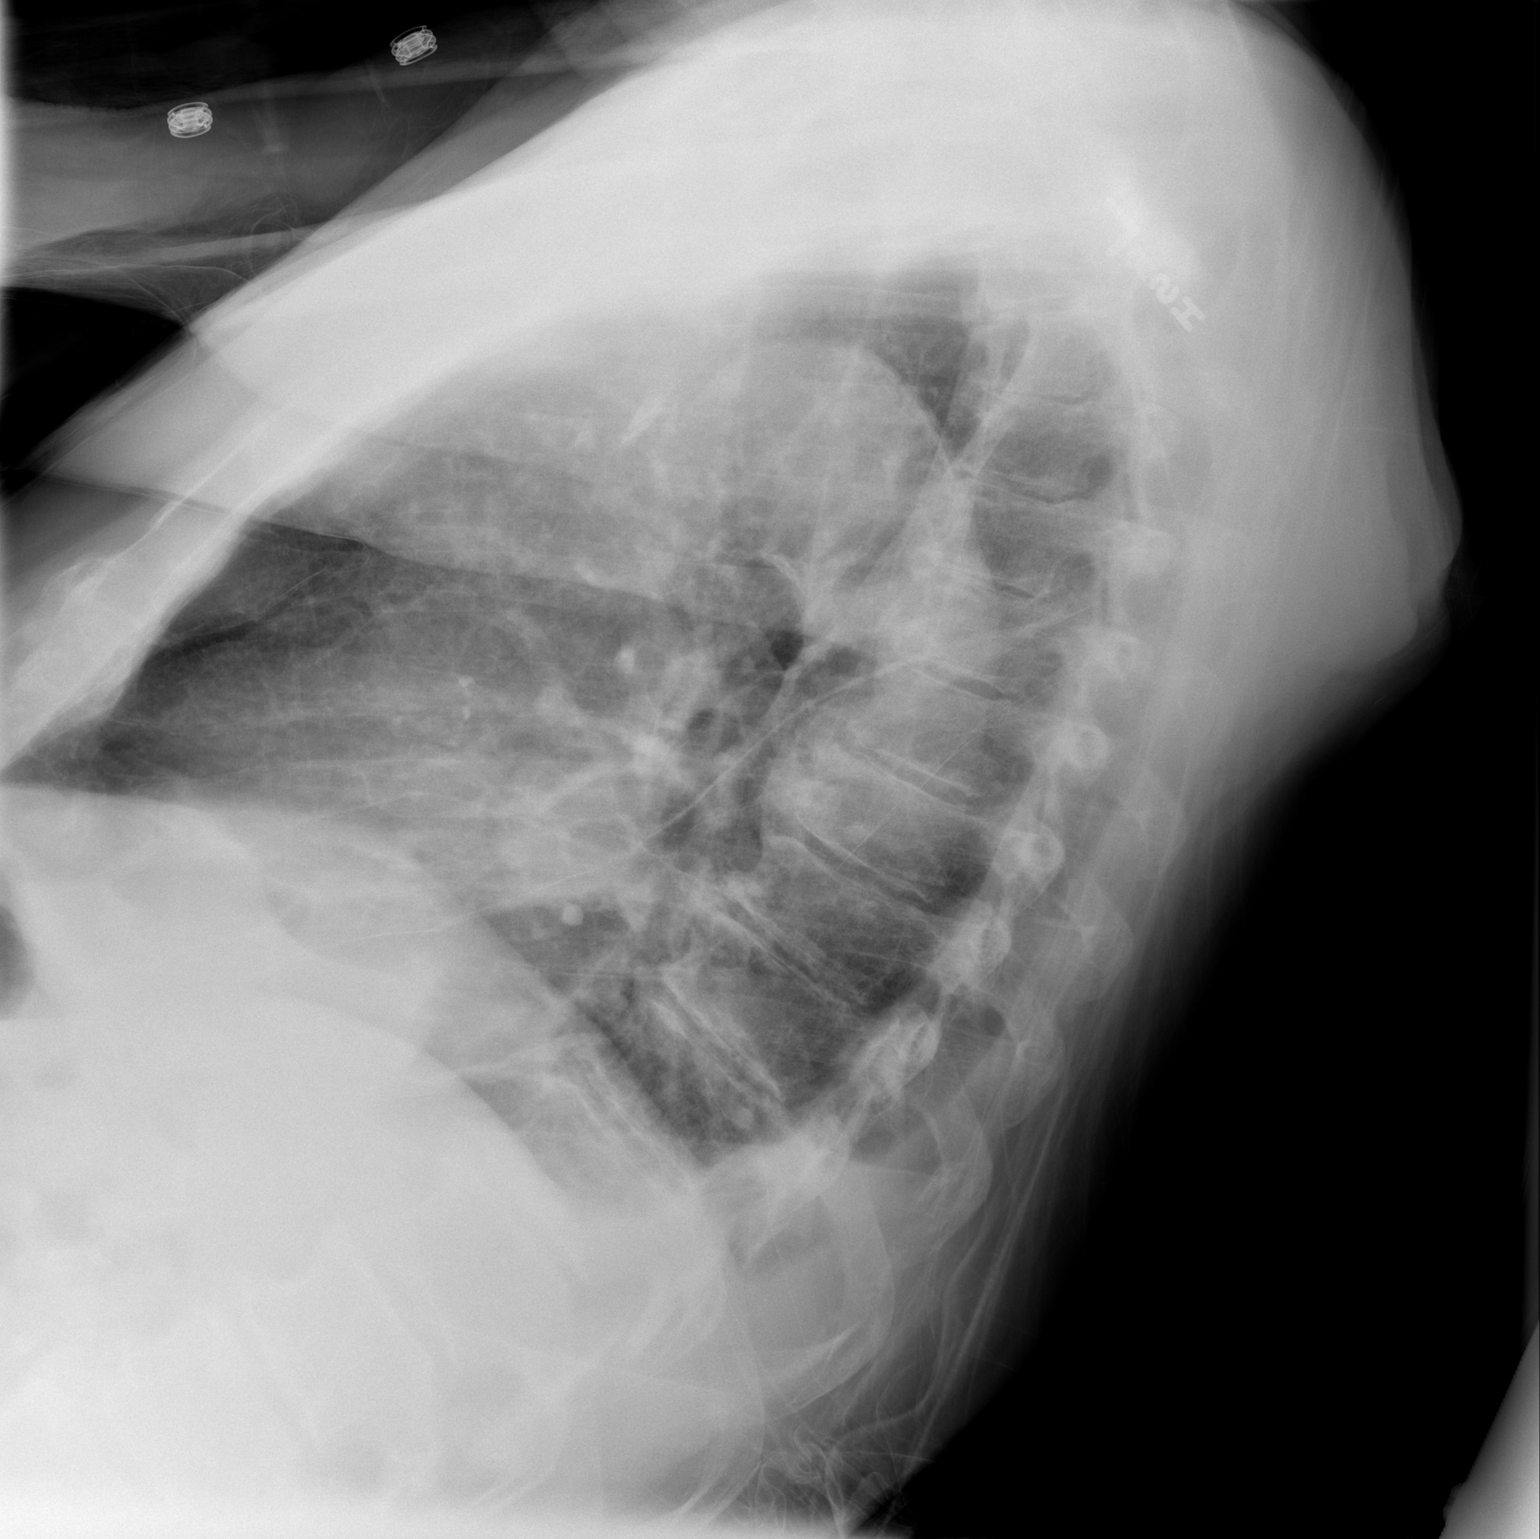

[1 of 1 positions shown; findings below may reference images not displayed]

FINDINGS: No infiltrate.  Subsegmental atelectasis is again noted
at the right base. Minimal right pleural effusion.  Tortuous
ectatic aortic arch.
IMPRESSION: Negative for pneumonia.  Right base subsegmental atelectasis.
Minimal right pleural effusion.

## 2011-05-07 LAB — COMPREHENSIVE METABOLIC PANEL
BUN: 22
CO2: 26
Calcium: 9.2
Chloride: 103
Creatinine, Ser: 0.92
GFR calc non Af Amer: >60
Glucose, Bld: 117 — ABNORMAL HIGH
Total Bilirubin: 0.8

## 2011-05-07 LAB — URINALYSIS, ROUTINE W REFLEX MICROSCOPIC
Glucose, UA: NEGATIVE
Hgb urine dipstick: NEGATIVE
Protein, ur: NEGATIVE
pH: 6.5

## 2011-05-07 LAB — DIFFERENTIAL
Basophils Absolute: 0
Eosinophils Absolute: 0.7
Lymphocytes Relative: 18
Monocytes Relative: 6

## 2011-05-07 LAB — CBC
HCT: 36.1 — ABNORMAL LOW
Hemoglobin: 11.5 — ABNORMAL LOW
MCHC: 31.9
MCV: 74.1 — ABNORMAL LOW
RBC: 4.87

## 2011-05-07 LAB — PROTIME-INR: Prothrombin Time: 13.5

## 2011-07-16 ENCOUNTER — Other Ambulatory Visit: Payer: Self-pay

## 2011-07-16 ENCOUNTER — Emergency Department (HOSPITAL_COMMUNITY): Payer: Medicare Other

## 2011-07-16 ENCOUNTER — Inpatient Hospital Stay (HOSPITAL_COMMUNITY)
Admission: EM | Admit: 2011-07-16 | Discharge: 2011-07-20 | DRG: 871 | Disposition: A | Discharge status: Skilled Nursing Facility | Payer: Medicare Other | Attending: Internal Medicine | Admitting: Internal Medicine

## 2011-07-16 ENCOUNTER — Encounter: Payer: Self-pay | Admitting: *Deleted

## 2011-07-16 ENCOUNTER — Inpatient Hospital Stay (HOSPITAL_COMMUNITY): Payer: Medicare Other

## 2011-07-16 DIAGNOSIS — Z66 Do not resuscitate: Secondary | ICD-10-CM | POA: Diagnosis present

## 2011-07-16 DIAGNOSIS — A499 Bacterial infection, unspecified: Secondary | ICD-10-CM | POA: Diagnosis present

## 2011-07-16 DIAGNOSIS — G92 Toxic encephalopathy: Secondary | ICD-10-CM | POA: Diagnosis present

## 2011-07-16 DIAGNOSIS — R5381 Other malaise: Secondary | ICD-10-CM | POA: Diagnosis present

## 2011-07-16 DIAGNOSIS — I69920 Aphasia following unspecified cerebrovascular disease: Secondary | ICD-10-CM

## 2011-07-16 DIAGNOSIS — I69320 Aphasia following cerebral infarction: Secondary | ICD-10-CM

## 2011-07-16 DIAGNOSIS — J4489 Other specified chronic obstructive pulmonary disease: Secondary | ICD-10-CM | POA: Diagnosis present

## 2011-07-16 DIAGNOSIS — R6521 Severe sepsis with septic shock: Secondary | ICD-10-CM

## 2011-07-16 DIAGNOSIS — I69959 Hemiplegia and hemiparesis following unspecified cerebrovascular disease affecting unspecified side: Secondary | ICD-10-CM

## 2011-07-16 DIAGNOSIS — R6520 Severe sepsis without septic shock: Secondary | ICD-10-CM | POA: Diagnosis present

## 2011-07-16 DIAGNOSIS — A419 Sepsis, unspecified organism: Principal | ICD-10-CM | POA: Diagnosis present

## 2011-07-16 DIAGNOSIS — N390 Urinary tract infection, site not specified: Secondary | ICD-10-CM | POA: Diagnosis present

## 2011-07-16 DIAGNOSIS — D649 Anemia, unspecified: Secondary | ICD-10-CM | POA: Diagnosis present

## 2011-07-16 DIAGNOSIS — J449 Chronic obstructive pulmonary disease, unspecified: Secondary | ICD-10-CM | POA: Diagnosis present

## 2011-07-16 DIAGNOSIS — E559 Vitamin D deficiency, unspecified: Secondary | ICD-10-CM | POA: Diagnosis present

## 2011-07-16 DIAGNOSIS — E46 Unspecified protein-calorie malnutrition: Secondary | ICD-10-CM | POA: Diagnosis present

## 2011-07-16 DIAGNOSIS — G929 Unspecified toxic encephalopathy: Secondary | ICD-10-CM | POA: Diagnosis present

## 2011-07-16 DIAGNOSIS — G9340 Encephalopathy, unspecified: Secondary | ICD-10-CM | POA: Diagnosis present

## 2011-07-16 DIAGNOSIS — K219 Gastro-esophageal reflux disease without esophagitis: Secondary | ICD-10-CM | POA: Diagnosis present

## 2011-07-16 DIAGNOSIS — D638 Anemia in other chronic diseases classified elsewhere: Secondary | ICD-10-CM | POA: Diagnosis present

## 2011-07-16 DIAGNOSIS — I10 Essential (primary) hypertension: Secondary | ICD-10-CM | POA: Diagnosis present

## 2011-07-16 HISTORY — DX: Cerebrovascular disease, unspecified: I67.9

## 2011-07-16 HISTORY — DX: Other chronic pain: G89.29

## 2011-07-16 HISTORY — DX: Polyneuropathy, unspecified: G62.9

## 2011-07-16 HISTORY — DX: Lymphedema, not elsewhere classified: I89.0

## 2011-07-16 HISTORY — DX: Insomnia, unspecified: G47.00

## 2011-07-16 HISTORY — DX: Anemia, unspecified: D64.9

## 2011-07-16 HISTORY — DX: Depression, unspecified: F32.A

## 2011-07-16 HISTORY — DX: Dehydration: E86.0

## 2011-07-16 HISTORY — DX: Aphasia: R47.01

## 2011-07-16 HISTORY — DX: Hypokalemia: E87.6

## 2011-07-16 HISTORY — DX: Major depressive disorder, single episode, unspecified: F32.9

## 2011-07-16 LAB — LACTIC ACID, PLASMA: Lactic Acid, Venous: 1.7 mmol/L (ref 0.5–2.2)

## 2011-07-16 LAB — MRSA PCR SCREENING: MRSA by PCR: NEGATIVE

## 2011-07-16 LAB — COMPREHENSIVE METABOLIC PANEL
AST: 23 U/L (ref 0–37)
Albumin: 2.6 g/dL — ABNORMAL LOW (ref 3.5–5.2)
Alkaline Phosphatase: 108 U/L (ref 39–117)
BUN: 38 mg/dL — ABNORMAL HIGH (ref 6–23)
Chloride: 105 mEq/L (ref 96–112)
Potassium: 4.1 mEq/L (ref 3.5–5.1)
Sodium: 137 mEq/L (ref 135–145)
Total Bilirubin: 0.6 mg/dL (ref 0.3–1.2)
Total Protein: 5.9 g/dL — ABNORMAL LOW (ref 6.0–8.3)

## 2011-07-16 LAB — CARBOXYHEMOGLOBIN: O2 Saturation: 66.9 %

## 2011-07-16 LAB — URINE MICROSCOPIC-ADD ON

## 2011-07-16 LAB — CBC
Hemoglobin: 12.7 g/dL — ABNORMAL LOW (ref 13.0–17.0)
MCH: 31 pg (ref 26.0–34.0)
MCHC: 34.1 g/dL (ref 30.0–36.0)
Platelets: 121 10*3/uL — ABNORMAL LOW (ref 150–400)
RDW: 14.9 % (ref 11.5–15.5)

## 2011-07-16 LAB — URINALYSIS, ROUTINE W REFLEX MICROSCOPIC
Bilirubin Urine: NEGATIVE
Hgb urine dipstick: NEGATIVE
Nitrite: NEGATIVE
Specific Gravity, Urine: 1.023 (ref 1.005–1.030)
Urobilinogen, UA: 1 mg/dL (ref 0.0–1.0)
pH: 7.5 (ref 5.0–8.0)

## 2011-07-16 LAB — FIBRINOGEN: Fibrinogen: 401 mg/dL (ref 204–475)

## 2011-07-16 LAB — TYPE AND SCREEN
ABO/RH(D): O POS
Antibody Screen: NEGATIVE

## 2011-07-16 LAB — DIFFERENTIAL
Basophils Absolute: 0 10*3/uL (ref 0.0–0.1)
Basophils Relative: 0 % (ref 0–1)
Eosinophils Absolute: 0 10*3/uL (ref 0.0–0.7)
Neutro Abs: 7.5 10*3/uL (ref 1.7–7.7)
Neutrophils Relative %: 92 % — ABNORMAL HIGH (ref 43–77)

## 2011-07-16 LAB — PROTIME-INR: Prothrombin Time: 16.7 seconds — ABNORMAL HIGH (ref 11.6–15.2)

## 2011-07-16 LAB — ABO/RH: ABO/RH(D): O POS

## 2011-07-16 LAB — CORTISOL: Cortisol, Plasma: 6.8 ug/dL

## 2011-07-16 MED ORDER — ACETAMINOPHEN 10 MG/ML IV SOLN
INTRAVENOUS | Status: AC
  Filled 2011-07-16: qty 100

## 2011-07-16 MED ORDER — DOBUTAMINE IN D5W 4-5 MG/ML-% IV SOLN: 2.5000 ug/kg/min | INTRAVENOUS | Status: DC | PRN

## 2011-07-16 MED ORDER — SODIUM CHLORIDE 0.9 % IV BOLUS (SEPSIS)
1000.0000 mL | Freq: Once | INTRAVENOUS | Status: AC
  Administered 2011-07-16: 1000 mL via INTRAVENOUS

## 2011-07-16 MED ORDER — SODIUM CHLORIDE 0.9 % IV SOLN
INTRAVENOUS | Status: DC
  Administered 2011-07-16 – 2011-07-17 (×3): via INTRAVENOUS

## 2011-07-16 MED ORDER — SODIUM CHLORIDE 0.9 % IV BOLUS (SEPSIS): 500.0000 mL | Freq: Once | INTRAVENOUS | Status: DC

## 2011-07-16 MED ORDER — SODIUM CHLORIDE 0.9 % IV BOLUS (SEPSIS): 2000.0000 mL | Freq: Once | INTRAVENOUS | Status: DC

## 2011-07-16 MED ORDER — ACETAMINOPHEN 500 MG PO TABS
1000.0000 mg | ORAL_TABLET | Freq: Once | ORAL | Status: AC
  Administered 2011-07-16: 1000 mg via ORAL
  Filled 2011-07-16: qty 2

## 2011-07-16 MED ORDER — PIPERACILLIN-TAZOBACTAM 3.375 G IVPB 30 MIN
3.3750 g | INTRAVENOUS | Status: DC
  Filled 2011-07-16: qty 50

## 2011-07-16 MED ORDER — PIPERACILLIN-TAZOBACTAM 3.375 G IVPB
3.3750 g | Freq: Three times a day (TID) | INTRAVENOUS | Status: DC
  Administered 2011-07-16 – 2011-07-19 (×10): 3.375 g via INTRAVENOUS
  Filled 2011-07-16 (×17): qty 50

## 2011-07-16 MED ORDER — ACETAMINOPHEN 160 MG/5ML PO SOLN
650.0000 mg | ORAL | Status: DC | PRN
  Administered 2011-07-16: 650 mg
  Filled 2011-07-16: qty 20.3

## 2011-07-16 MED ORDER — PREGABALIN 75 MG PO CAPS
75.0000 mg | ORAL_CAPSULE | Freq: Two times a day (BID) | ORAL | Status: DC
  Administered 2011-07-16 – 2011-07-20 (×9): 75 mg via ORAL
  Filled 2011-07-16 (×9): qty 1

## 2011-07-16 MED ORDER — NOREPINEPHRINE BITARTRATE 1 MG/ML IJ SOLN: 2.0000 ug/min | Freq: Once | INTRAMUSCULAR | Status: DC

## 2011-07-16 MED ORDER — NOREPINEPHRINE BITARTRATE 1 MG/ML IJ SOLN
2.0000 ug/min | Freq: Once | INTRAVENOUS | Status: AC
  Administered 2011-07-16: 5 ug/min via INTRAVENOUS
  Filled 2011-07-16: qty 4

## 2011-07-16 MED ORDER — ENOXAPARIN SODIUM 40 MG/0.4ML ~~LOC~~ SOLN
40.0000 mg | SUBCUTANEOUS | Status: DC
  Administered 2011-07-16 – 2011-07-20 (×5): 40 mg via SUBCUTANEOUS
  Filled 2011-07-16 (×6): qty 0.4

## 2011-07-16 MED ORDER — CEFAZOLIN SODIUM-DEXTROSE 2-3 GM-% IV SOLR
INTRAVENOUS | Status: AC
  Filled 2011-07-16: qty 50

## 2011-07-16 MED ORDER — VANCOMYCIN HCL IN DEXTROSE 1-5 GM/200ML-% IV SOLN
1000.0000 mg | INTRAVENOUS | Status: DC
  Filled 2011-07-16: qty 200

## 2011-07-16 MED ORDER — SODIUM CHLORIDE 0.9 % IV BOLUS (SEPSIS): 25.0000 mL/kg | Freq: Once | INTRAVENOUS | Status: DC

## 2011-07-16 MED ORDER — PANTOPRAZOLE SODIUM 40 MG PO TBEC
40.0000 mg | DELAYED_RELEASE_TABLET | Freq: Every day | ORAL | Status: DC
  Administered 2011-07-16: 40 mg via ORAL
  Filled 2011-07-16 (×2): qty 1

## 2011-07-16 MED ORDER — PIPERACILLIN SOD-TAZOBACTAM SO 2.25 (2-0.25) G IV SOLR
3.3750 g | Freq: Once | INTRAVENOUS | Status: AC
  Administered 2011-07-16: 3.375 g via INTRAVENOUS
  Filled 2011-07-16: qty 3.38

## 2011-07-16 MED ORDER — SODIUM CHLORIDE 0.9 % IV SOLN
35.0000 mL/kg | INTRAVENOUS | Status: DC
  Administered 2011-07-16: 1000 mL via INTRAVENOUS

## 2011-07-16 MED ORDER — VANCOMYCIN HCL IN DEXTROSE 1-5 GM/200ML-% IV SOLN
1000.0000 mg | Freq: Two times a day (BID) | INTRAVENOUS | Status: DC
  Administered 2011-07-16 – 2011-07-17 (×2): 1000 mg via INTRAVENOUS
  Filled 2011-07-16 (×6): qty 200

## 2011-07-16 MED ORDER — VANCOMYCIN HCL IN DEXTROSE 1-5 GM/200ML-% IV SOLN
1000.0000 mg | Freq: Once | INTRAVENOUS | Status: AC
  Administered 2011-07-16: 1000 mg via INTRAVENOUS
  Filled 2011-07-16: qty 200

## 2011-07-16 MED ORDER — SODIUM CHLORIDE 0.9 % IV SOLN: 250.0000 mL | INTRAVENOUS | Status: DC | PRN

## 2011-07-16 MED ORDER — NOREPINEPHRINE BITARTRATE 1 MG/ML IJ SOLN
2.0000 ug/min | INTRAVENOUS | Status: DC | PRN
  Filled 2011-07-16: qty 4

## 2011-07-16 MED ORDER — SODIUM CHLORIDE 0.9 % IV BOLUS (SEPSIS): 500.0000 mL | INTRAVENOUS | Status: DC | PRN

## 2011-07-16 NOTE — ED Notes (Signed)
Dr. Craige Cotta at [facility] notified to initiate Code Sepsis tracking on patient.

## 2011-07-16 NOTE — Progress Notes (Signed)
Bedside Physician-Brief Progress Note Patient Name: Bradley Boyle DOB: Mar 23, 1930 MRN: [id number]  Date of Service  07/16/2011   HPI/Events of Note   REviewed at bedside. Earlier admit by Dr Michele Rockers Off pressors but currently tachycardic with a atrial fibrillation rapid ventricular rate 130 - 160 . Febrile to 101 Fahrenheit. Alert pleasant but confused not agitated. Current CVP is 9   eICU Interventions   place panda  for giving Tylenol  Fluid bolus to increase CVP greater than 12 Order written for preestablished DO NOT RESUSCITATE status       Nasya Vincent 07/16/2011, 4:17 PM

## 2011-07-16 NOTE — ED Notes (Signed)
ZOX:[id number]<AV> Expected date:07/16/11<BR> Expected time: 8:04 AM<BR> Means of arrival:Ambulance<BR> Comments:<BR> Bloody stool

## 2011-07-16 NOTE — ED Notes (Signed)
Rec'd patient from ED Rm #9 s/p insertion of left IJ triple lumen CVC. Patient alert at this time. Answers questions appropriately. Denies pain or discomfort. Breath sounds diminished bilaterally. Skin warm and dry. Cardiac monitor shows a-fib, rate 120s.  Patient has wet, non-productive cough at this time. Patient incontinent of small amount of soft, brown stool. Pericare provided. Temp foley in place draining amber urine with sediment noted. 40ml urine emptied from foley. Small amount of red drainage noted to urinary meatus. Area cleaned with pericleanse and foley stat-lock securement device applied.  Site care provided to site of left IJ. Skin prepped with Benzoin and new Sorbaview shield applied.

## 2011-07-16 NOTE — ED Notes (Addendum)
Called elink nurse [place], rn. She reported that after his ScvO2  Results the 6 hour sepsis protocol was successful and to just continue and keep pt stable. Per protocol scvO2 goal is 70% or greater. Pt was 66.9%. Elink nurse reported this was fine, as long as it was over 65%.

## 2011-07-16 NOTE — ED Notes (Signed)
Norepinephrine gtt increase to 10/mcg/min.

## 2011-07-16 NOTE — ED Notes (Signed)
[place], MD at bedside.

## 2011-07-16 NOTE — Progress Notes (Signed)
ANTIBIOTIC CONSULT NOTE - INITIAL  Pharmacy Consult for Vancomycin & Zosyn Indication: Sepsis, possibly urinary source.  No Known Allergies  Patient Measurements: Height: 5\' 9"  (175.3 cm) Weight: 165 lb (74.844 kg) IBW/kg (Calculated) : 70.7   Vital Signs: Temp: 98.1 F (36.7 C) (12/17 0906) Temp src: Other (Comment) (12/17 0906) BP: 113/74 mmHg (12/17 0906) Pulse Rate: 110  (12/17 0906) Intake/Output from previous day: 12/16 0701 - 12/17 0700 In: 2800 [I.V.:2800] Out: 40 [Urine:40] Intake/Output from this shift:    Labs:  [facility] 07/16/11 0214  WBC 8.2  HGB 12.7*  PLT 121*  LABCREA --  CREATININE 1.14   Estimated Creatinine Clearance: 52.5 ml/min (by C-G formula based on Cr of 1.14). No results found for this basename: VANCOTROUGH:2,VANCOPEAK:2,VANCORANDOM:2,GENTTROUGH:2,GENTPEAK:2,GENTRANDOM:2,TOBRATROUGH:2,TOBRAPEAK:2,TOBRARND:2,AMIKACINPEAK:2,AMIKACINTROU:2,AMIKACIN:2, in the last 72 hours   Microbiology: No results found for this or any previous visit (from the past 720 hour(s)).  Medical History: Past Medical History  Diagnosis Date  . Cerebral vascular disease   . Hypotension   . Neuropathy   . Chronic pain   . Lymphedema   . Depressive disorder   . Insomnia   . Aphasia   . Hypokalemia   . Diabetes mellitus   . Anemia   . Dehydration     Medications:   (Not in a hospital admission) Anti-infectives     Start     Dose/Rate Route Frequency Ordered Stop   07/16/11 0845   vancomycin (VANCOCIN) IVPB 1000 mg/200 mL premix        1,000 mg 200 mL/hr over 60 Minutes Intravenous To Emergency Dept 07/16/11 0831 07/17/11 0845   07/16/11 0845   piperacillin-tazobactam (ZOSYN) IVPB 3.375 g        3.375 g 100 mL/hr over 30 Minutes Intravenous To Emergency Dept 07/16/11 0842 07/17/11 0845   07/16/11 0400   vancomycin (VANCOCIN) IVPB 1000 mg/200 mL premix        1,000 mg 200 mL/hr over 60 Minutes Intravenous  Once 07/16/11 0347 07/16/11 0521   07/16/11  0400   piperacillin-tazobactam (ZOSYN) 3.375 g in dextrose 5 % 50 mL IVPB        3.375 g 100 mL/hr over 30 Minutes Intravenous  Once 07/16/11 0347 07/16/11 0445         Assessment: 77yo M admitted with hypotension and fever. U/A +WBCs. Starting empiric broad-spectrum abx with Vanc/Zosyn.  Goal of Therapy:  Vancomycin trough level 15-20 mcg/ml  Plan:  Zosyn 3.375g IV Q8H infused over 4hrs. Vancomycin 1g IV q12h Measure antibiotic drug levels at steady state Follow up culture results   Bradley Boyle 07/16/2011,9:20 AM

## 2011-07-16 NOTE — ED Notes (Signed)
2nd NS 500 ml bolus initiated for CVP 17

## 2011-07-16 NOTE — ED Provider Notes (Addendum)
History     CSN: [id number] Arrival date & time: 07/16/2011  1:06 AM   First MD Initiated Contact with Patient 07/16/11 0120      Chief Complaint  Patient presents with  . Fever  . Hypotension    (Consider location/radiation/quality/duration/timing/severity/associated sxs/prior treatment) HPI 77 year old gentleman presents to the emergency department from EMS with report of fever, hypotension, and hypoxia. Patient complaining of lower abdominal pain. Patient denies cough chest pain. He denies any urinary symptoms. EMS reports patient has history of low blood pressures but not this low normally per nursing home. Patient awake alert is oriented to self and place. Patient was slightly difficult to understand speech at times Past Medical History  Diagnosis Date  . Cerebral vascular disease   . Hypotension   . Neuropathy   . Chronic pain   . Lymphedema   . Depressive disorder   . Insomnia   . Aphasia   . Hypokalemia   . Diabetes mellitus   . Anemia   . Dehydration     History reviewed. No pertinent past surgical history.  History reviewed. No pertinent family history.  History  Substance Use Topics  . Smoking status: Not on file  . Smokeless tobacco: Not on file  . Alcohol Use:       Review of Systems  Constitutional: Positive for fever.  HENT: Negative.   Eyes: Negative.   Respiratory: Positive for cough. Negative for shortness of breath.   Cardiovascular: Negative.   Gastrointestinal: Positive for abdominal pain.  Genitourinary: Negative.   Musculoskeletal: Negative.   Skin: Negative.   Neurological: Negative.   Hematological: Negative.     Allergies  Review of patient's allergies indicates not on file.  Home Medications  No current outpatient prescriptions on file.  BP 72/52  Pulse 124  Temp(Src) 103 F (39.4 C) (Rectal)  Resp 20  Ht 5\' 9"  (1.753 m)  Wt 165 lb (74.844 kg)  BMI 24.37 kg/m2  SpO2 91%  Physical Exam  Nursing note and vitals  reviewed. Constitutional: He is oriented to person, place, and time.       Patient elderly, frail ill-appearing, but awake and alert and responsive  HENT:  Head: Atraumatic.       Mucous membranes dry  Eyes: Conjunctivae are normal.  Neck: Normal range of motion. Neck supple. No JVD present. No tracheal deviation present. No thyromegaly present.  Cardiovascular: Normal heart sounds and intact distal pulses.  Exam reveals no gallop and no friction rub.   No murmur heard.      Tachycardia with irregular rhythm noted  Pulmonary/Chest: Effort normal and breath sounds normal. No stridor. No respiratory distress. He has no wheezes. He has no rales. He exhibits no tenderness.       Patient with cough  Abdominal: Soft. Bowel sounds are normal. He exhibits distension. He exhibits no mass. There is tenderness (diffuse tenderness to palpation across lower abdomen). There is no rebound and no guarding.  Genitourinary: Rectum normal and penis normal.       Well-healed sacral ulcers noted  Musculoskeletal: Normal range of motion. He exhibits edema. He exhibits no tenderness.  Lymphadenopathy:    He has no cervical adenopathy.  Neurological: He is oriented to person, place, and time.  Skin: Skin is warm and dry. No rash noted. No erythema. No pallor.  Psychiatric: He has a normal mood and affect. His behavior is normal. Judgment and thought content normal.    ED Course  CENTRAL LINE Date/Time: 07/16/2011  3:37 AM Performed by: Olivia Mackie Authorized by: Olivia Mackie Consent: Verbal consent obtained. Risks and benefits: risks, benefits and alternatives were discussed Consent given by: patient Patient understanding: patient states understanding of the procedure being performed Required items: required blood products, implants, devices, and special equipment available Patient identity confirmed: verbally with patient Time out: Immediately prior to procedure a "time out" was called to verify the  correct patient, procedure, equipment, support staff and site/side marked as required. Indications: vascular access and central pressure monitoring Anesthesia: local infiltration Local anesthetic: lidocaine 1% without epinephrine Anesthetic total: 5 ml Patient sedated: no Preparation: skin prepped with 2% chlorhexidine Skin prep agent dried: skin prep agent completely dried prior to procedure Sterile barriers: all five maximum sterile barriers used - cap, mask, sterile gown, sterile gloves, and large sterile sheet Hand hygiene: hand hygiene performed prior to central venous catheter insertion Location details: left internal jugular Site selection rationale: contracture on right side precluded placement in right ij Patient position: Trendelenburg Catheter type: triple lumen Pre-procedure: landmarks identified Ultrasound guidance: yes Number of attempts: 1 Successful placement: yes Post-procedure: line sutured and dressing applied Assessment: blood return through all parts, free fluid flow, placement verified by x-ray and no pneumothorax on x-ray Patient tolerance: Patient tolerated the procedure well with no immediate complications.   (including critical care time)  Labs Reviewed  CBC - Abnormal; Notable for the following:    RBC 4.10 (*)    Hemoglobin 12.7 (*)    HCT 37.2 (*)    Platelets 121 (*)    All other components within normal limits  DIFFERENTIAL - Abnormal; Notable for the following:    Neutrophils Relative 92 (*)    Lymphocytes Relative 5 (*)    Lymphs Abs 0.4 (*)    All other components within normal limits  COMPREHENSIVE METABOLIC PANEL - Abnormal; Notable for the following:    BUN 38 (*)    Calcium 8.2 (*)    Total Protein 5.9 (*)    Albumin 2.6 (*)    GFR calc non Af Amer 59 (*)    GFR calc Af Amer 69 (*)    All other components within normal limits  URINALYSIS, ROUTINE W REFLEX MICROSCOPIC - Abnormal; Notable for the following:    Color, Urine AMBER (*)  BIOCHEMICALS MAY BE AFFECTED BY COLOR   APPearance CLOUDY (*)    Ketones, ur TRACE (*)    Protein, ur 100 (*)    Leukocytes, UA LARGE (*)    All other components within normal limits  LIPASE, BLOOD - Abnormal; Notable for the following:    Lipase 9 (*)    All other components within normal limits  URINE MICROSCOPIC-ADD ON - Abnormal; Notable for the following:    Bacteria, UA MANY (*)    All other components within normal limits  LACTIC ACID, PLASMA  PROCALCITONIN  CULTURE, BLOOD (ROUTINE X 2)  CULTURE, BLOOD (ROUTINE X 2)  URINE CULTURE   CRITICAL CARE Performed by: Olivia Mackie   Total critical care time: 75  Critical care time was exclusive of separately billable procedures and treating other patients.  Critical care was necessary to treat or prevent imminent or life-threatening deterioration.  Critical care was time spent personally by me on the following activities: development of treatment plan with patient and/or surrogate as well as nursing, discussions with consultants, evaluation of patient's response to treatment, examination of patient, obtaining history from patient or surrogate, ordering and performing treatments and interventions, ordering  and review of laboratory studies, ordering and review of radiographic studies, pulse oximetry and re-evaluation of patient's condition. Dg Chest Port 1 View  07/16/2011  *RADIOLOGY REPORT*  Clinical Data: Fever, hypotension, cough  PORTABLE CHEST - 1 VIEW  Comparison: 01/03/2009  Findings: The patient is rotated.  Increased interstitial markings, possibly reflecting mild interstitial edema.  No pleural effusion or pneumothorax.  The heart is top normal in size.  Degenerative changes of the visualized thoracolumbar spine.  IMPRESSION: Possible mild interstitial edema.  Original Report Authenticated By: Charline Bills, M.D.    Date: 07/16/2011  Rate: 127  Rhythm: atrial fibrillation  QRS Axis: left  Intervals: atrial fib  ST/T  Wave abnormalities: normal  Conduction Disutrbances:left anterior fascicular block  Narrative Interpretation: low voltage, new afib from old  Old EKG Reviewed: changes noted    1. Sepsis   2. Urinary tract infection, acute   3. Hypotension       MDM  77 year old gentleman presenting from nursing home with signs of sepsis. Patient with cough and hypoxia, but lung sounds clear less likely to be pneumonia. Will check chest x-ray for same. Concerned given lower abdominal pain for urinary tract infection, urosepsis, diverticulitis or other abdominal infection. Will treat fever, give fluid boluses to treat hypotension and monitor closely      3:35 AM  Patient with persistent hypotension with systolics in the 50s. Patient had 2 L of fluid without improvement in blood pressures. Left IJ placed without difficulties. Attempted right IJ, however given patient's contracture to the right side, anatomy not favorable for placement of right IJ. Patient tolerated procedure well. Discussed with Dr. Craige Cotta with critical care regarding patient's probable urosepsis. Will start antibiotics, levofed. Patient verbally agreed to pressors, antibiotics, and central line placement.   Olivia Mackie, MD 07/16/11 4098  Olivia Mackie, MD 07/16/11 1191  Olivia Mackie, MD 07/16/11 [phone]

## 2011-07-16 NOTE — ED Notes (Signed)
Per EMS - pt from [facility] - pt reported to be febrile and hypotensive. EMS obtained BP of 73/48 and HR of 110.

## 2011-07-16 NOTE — H&P (Signed)
Patient name: Bradley Boyle Medical record number: [id number] Date of birth: 19-Feb-1930 Age: 77 y.o. Gender: male PCP: Kimber Relic, MD, MD  Date: 07/16/2011 Reason for Consult: lower abdominal pain Referring Physician: Norlene Campbell  Brief history This is a 77yo M who presented with HYPOtension and likley urinary source.  Lines/tubes L IJ CVL 12/17 >>> Foley 12/17 >>>  Culture data/sepsis markers Blood cx 12/17 Urine cx 12/17  Antibiotics vanc 12/17 Zosyn 12/17  Best practice [facility] PPI  Protocols/consults EGDT /17 >>>  Events/studies n/a  HPI: This is a 77yo M NH resident who presented to [facility] ER today with lower abdominal pain. He denies dysuria and frequency. No cough. No emesis or diarrhea. Denies fevers, night sweats, and chills. No presyncopal or syncopal symptoms. In the ER he was febrile to 103 and had SBP in the 70 (apparently baseline in 90s) that did not appropriately respond to IVF. Neither lactate or PCT was elevated. A central line was placed and he was started on pressors. LFTs and lipase were wnl but u/a had 21-50 WBC. He was given vanc and zosyn and started on EGDT.  ROS: unremarkable unless stated above otherwise  Past Medical History  Diagnosis Date  . Cerebral vascular disease   . Hypotension   . Neuropathy   . Chronic pain   . Lymphedema   . Depressive disorder   . Insomnia   . Aphasia   . Hypokalemia   . Diabetes mellitus   . Anemia   . Dehydration    History reviewed. No pertinent past surgical history.  History reviewed. No pertinent family history.  Social History:  does not have a smoking history on file. He does not have any smokeless tobacco history on file. His alcohol and drug histories not on file.  Allergies: No Known Allergies  Medications:  Prior to Admission medications   Medication Sig Start Date End Date Taking? Authorizing Provider  acetaminophen (TYLENOL) 325 MG tablet 650 mg every 6 (six) hours as needed. Fever pain     Yes Historical Provider, MD  albuterol (PROVENTIL) (2.5 MG/3ML) 0.083% nebulizer solution Take 2.5 mg by nebulization every 6 (six) hours as needed. Shortness of breath    Yes Historical Provider, MD  moxifloxacin (AVELOX) 400 MG tablet Take 400 mg by mouth daily. End on 07/22/11 seven day therapy  07/15/11 07/22/11 Yes Historical Provider, MD  Multiple Vitamin (MULITIVITAMIN WITH MINERALS) TABS Take 1 tablet by mouth daily.     Yes Historical Provider, MD  pregabalin (LYRICA) 75 MG capsule Take 75 mg by mouth 3 (three) times daily.     Yes Historical Provider, MD  ranitidine (ZANTAC) 150 MG tablet Take 150 mg by mouth at bedtime.     Yes Historical Provider, MD  vitamin C (ASCORBIC ACID) 500 MG tablet Take 500 mg by mouth daily.     Yes Historical Provider, MD  Vitamin D, Ergocalciferol, (DRISDOL) 50000 UNITS CAPS Take 50,000 Units by mouth every 7 (seven) days.     Yes Historical Provider, MD   Temp:  [place]quiline.Grounds F (39.4 C)] 103 F (39.4 C) (12/17 0112) Pulse Rate:  [124] 124  (12/17 0112) Resp:  [20] 20  (12/17 0112) BP: (72)/(52) 72/52 mmHg (12/17 0112) SpO2:  [88 %-91 %] 91 % (12/17 0112) Weight:  [74.844 kg (165 lb)] 165 lb (74.844 kg) (12/17 0154)  gen NAD ENT moist mucous membranes Neck no masses or JVD Lymph no cervical or supraclavicular lymphadenopathy CV tachycardic, no murmur pulm CTAB  without wheeszes or crackles abd soft, mild lower TTP, and non-distended, no rebound/invol guarding/referred pain Ext no LE edema or clubbing Skin no rashes Neuro L sided weakness  Lab Results  Component Value Date   CREATININE 1.14 07/16/2011   BUN 38* 07/16/2011   NA 137 07/16/2011   K 4.1 07/16/2011   CL 105 07/16/2011   CO2 24 07/16/2011   Lab Results  Component Value Date   WBC 8.2 07/16/2011   HGB 12.7* 07/16/2011   HCT 37.2* 07/16/2011   MCV 90.7 07/16/2011   PLT 121* 07/16/2011   Lab Results  Component Value Date   ALT 15 07/16/2011   AST 23 07/16/2011   ALKPHOS 108  07/16/2011   BILITOT 0.6 07/16/2011   Lab Results  Component Value Date   INR 1.0 07/06/2007   Assessment and Plan  This is a 77yo M who presented with fever and HYPOtension. His u/a was consistent with UTI.  1. Septic shock -- EGDT intitiated in ER -- current ABX: vanc, zosyn -- appropriate cultures sent -- had CVL, defer art line for now  2. Mild hypoxia -- uncler whether baseline or related to IVF -- continue to monitor  3. DM -- listed on problem list but glu normal in ER -- no specific treatement at this point  4. Best Practices PPI LMWH  DNR (confirmed with patient at bedside)  The patient is critically ill with septic shock and requires high complexity decision making for assessment and support, frequent evaluation and titration of therapies, application of advanced monitoring technologies and extensive interpretation of multiple databases.  Total critical care time excluding procedures:  Joie Bimler MD, MPH   Erric Machnik 07/16/2011, 4:09 AM

## 2011-07-16 NOTE — ED Notes (Signed)
WUJ:WJ19<JY> Expected date:07/16/11<BR> Expected time:12:56 AM<BR> Means of arrival:Ambulance<BR> Comments:<BR> M231 - 81yoM fever, hypotensive 72/50

## 2011-07-16 NOTE — ED Notes (Signed)
[facility] called and reports that pt does smoke, so pt may need a nicotine patch.

## 2011-07-17 ENCOUNTER — Encounter (HOSPITAL_COMMUNITY): Payer: Self-pay

## 2011-07-17 DIAGNOSIS — N390 Urinary tract infection, site not specified: Secondary | ICD-10-CM

## 2011-07-17 DIAGNOSIS — I69320 Aphasia following cerebral infarction: Secondary | ICD-10-CM

## 2011-07-17 DIAGNOSIS — R6521 Severe sepsis with septic shock: Secondary | ICD-10-CM

## 2011-07-17 DIAGNOSIS — R5381 Other malaise: Secondary | ICD-10-CM | POA: Diagnosis present

## 2011-07-17 DIAGNOSIS — A4150 Gram-negative sepsis, unspecified: Secondary | ICD-10-CM

## 2011-07-17 DIAGNOSIS — A499 Bacterial infection, unspecified: Secondary | ICD-10-CM

## 2011-07-17 DIAGNOSIS — G9340 Encephalopathy, unspecified: Secondary | ICD-10-CM | POA: Diagnosis present

## 2011-07-17 DIAGNOSIS — D649 Anemia, unspecified: Secondary | ICD-10-CM | POA: Diagnosis present

## 2011-07-17 DIAGNOSIS — A419 Sepsis, unspecified organism: Secondary | ICD-10-CM | POA: Diagnosis present

## 2011-07-17 HISTORY — DX: Other malaise: R53.81

## 2011-07-17 HISTORY — DX: Urinary tract infection, site not specified: N39.0

## 2011-07-17 HISTORY — DX: Aphasia following cerebral infarction: I69.320

## 2011-07-17 HISTORY — DX: Bacterial infection, unspecified: A49.9

## 2011-07-17 MED ORDER — PANTOPRAZOLE SODIUM 40 MG PO PACK
40.0000 mg | PACK | Freq: Every day | ORAL | Status: DC
  Administered 2011-07-17 – 2011-07-20 (×4): 40 mg
  Filled 2011-07-17 (×8): qty 20

## 2011-07-17 MED ORDER — BIOTENE DRY MOUTH MT LIQD
15.0000 mL | Freq: Two times a day (BID) | OROMUCOSAL | Status: DC
  Administered 2011-07-17 – 2011-07-20 (×6): 15 mL via OROMUCOSAL

## 2011-07-17 NOTE — Progress Notes (Signed)
Speech Language/Pathology Clinical/Bedside Swallow Evaluation Patient Details  Name: Bradley Boyle MRN: [id number] DOB: 09/07/1929 Today's Date: 07/17/2011 [id number]  Past Medical History:  Past Medical History  Diagnosis Date  . Cerebral vascular disease   . Hypotension   . Neuropathy   . Chronic pain   . Lymphedema   . Depressive disorder   . Insomnia   . Aphasia   . Hypokalemia   . Diabetes mellitus   . Anemia   . Dehydration    Past Surgical History: History reviewed. No pertinent past surgical history.  Assessment/Recommendations/Treatment Plan Suspected Esophageal Findings Suspected Esophageal Findings: Other (comment) (known h/o reflux)  SLP Assessment Clinical Impression Statement: Pt appears with functional swallow given h/o CVA and lack of dentures.  Known dysphagia from CVA with AUDIBLE aspiration on MBS in 2003.  SLP phoned SNF and spoke to RN who reports pt on regular/thin diet with adequate tolerance prior to admission.  Pt reports swallow function to be at baseline.  Rec soft/ground/thin with total assist d/t right hemiparesis but having pt feed self as able to maximize airway protection.   Other Related Risk Factors: History of GERD  Recommendations Solid Consistency: Dysphagia 3 (Mechanical soft) Liquid Consistency: Thin (prefer water with meals) Medication Administration: Whole meds with puree (follow with water) Compensations: Slow rate;Small sips/bites Postural Changes and/or Swallow Maneuvers: Seated upright 90 degrees;Upright 30-60 min after meal Oral Care Recommendations: Oral care QID Recommendations for Other Services: OT consult;PT consult Follow up Recommendations: Home health SLP  Treatment Plan Speech Therapy Frequency: min 2x/week Treatment Duration: 2 weeks Interventions: Aspiration precaution training;Compensatory techniques;Patient/family education;Diet toleration management by SLP;Trials of upgraded  texture/liquids  Prognosis Prognosis for Safe Diet Advancement: Good  Individuals Consulted Consulted and Agree with Results and Recommendations: Patient  Swallowing Goals  SLP Swallowing Goals Patient will consume recommended diet without observed clinical signs of aspiration with: Minimal assistance Swallow Study Goal #1 - Progress: Not Met Patient will utilize recommended strategies during swallow to increase swallowing safety with: Minimal assistance Swallow Study Goal #2 - Progress: Not met  Swallow Study Prior Functional Status   on regular/no added salt packet/thin at SNF, feeds self with adaptive equipment     Oral Motor/Sensory Function  Labial ROM: Reduced right Labial Symmetry: Abnormal symmetry right Labial Strength: Reduced Labial Sensation: Reduced Lingual ROM:  (midline) Facial ROM: Reduced right Facial Symmetry: Right droop Velum: Within Functional Limits (bilateral) Mandible: Within Functional Limits  Consistency Results  Ice Chips Ice chips: Within functional limits Other Comments: single bolus  Thin Liquid Thin Liquid: Within functional limits Presentation: Self Fed;Cup;Straw  Nectar Thick Liquid Nectar Thick Liquid: Not tested  Honey Thick Liquid Honey Thick Liquid: Not tested  Puree Puree: Impaired Presentation: Self Fed Pharyngeal Phase Impairments: Cough - Delayed Other Comments: icecream, applesauce-cough x2 after swallow of icecream when SLP distracting pt, no further episodes  Solid Solid: Within functional limits Other Comments: pt without dentition, but able to masticate cracker, mild incr wob and heart rate-pt may tolerate soft sandwiches and soft foods better   Chales Abrahams 07/17/2011,3:59 PM

## 2011-07-17 NOTE — Progress Notes (Signed)
CSW received consult as Pt is admitted from [facility]. CSW left message for daughter, Charlestine Massed to call CSW to discuss plans for Pt and if she would want him to return to SNF. CSW did speak with facility and Pt could return to SNF on discharge. He is a longstanding resident at [facility]. Yellow note in chart.  CSW will follow and assist with return to SNF as appropriate.   Vennie Homans, [place] 07/17/2011 10:29 AM [phone]

## 2011-07-17 NOTE — Progress Notes (Signed)
Patient name: BASSEM BERNASCONI Medical record number: [id number] Date of birth: 1929-11-28 Age: 77 y.o. Gender: male PCP: Kimber Relic, MD, MD  Date: 07/17/2011 Reason for Consult: lower abdominal pain Referring Physician: Norlene Campbell  Brief history This is a 77yo M who presented on 12/17 from SNF with Hypotension and likley urinary source.  Lines/tubes L IJ CVL 12/17 >>> Foley 12/17 >>>  Culture data/sepsis markers Blood cx 12/17 Urine cx 12/17: >100K GNR>>>  Antibiotics vanc 12/17>>>12/18 Zosyn 12/17  Best practice [facility] PPI  Protocols/consults EGDT /17 >>>  Events/studies N/a  Subjective Off all pressors, No distress. Feels better.   Objective Temp:  [99.1 F (37.3 C)-101.5 F (38.6 C)] 100.2 F (37.9 C) (12/18 1100) Pulse Rate:  [91-154] 128  (12/18 1100) Resp:  [13-24] 15  (12/18 1100) BP: (91-148)/(50-95) 98/57 mmHg (12/18 1100) SpO2:  [91 %-99 %] 94 % (12/18 1100) Weight:  [81.8 kg (180 lb 5.4 oz)-83.1 kg (183 lb 3.2 oz)] 183 lb 3.2 oz (83.1 kg) (12/18 0000) CVP:  [9 mmHg-12 mmHg] 12 mmHg    . sodium chloride 100 mL/hr at 07/17/11 0543    Intake/Output Summary (Last 24 hours) at 07/17/11 1205 Last data filed at 07/17/11 1100  Gross per 24 hour  Intake   3690 ml  Output   1698 ml  Net   1992 ml   Exam: gen NAD ENT moist mucous membranes Neck no masses or JVD CV tachycardic, no murmur pulm CTAB without wheeszes or crackles, decreased in bases abd soft, mild lower TTP, and non-distended, no rebound/invol guarding/referred pain Ext no LE edema or clubbing Skin no rashes Neuro L sided weakness  Lab 07/16/11 0214  NA 137  K 4.1  CL 105  CO2 24  BUN 38*  CREATININE 1.14  GLUCOSE 79    Lab 07/16/11 0214  HGB 12.7*  HCT 37.2*  WBC 8.2  PLT 121*   Assessment and Plan  This is a 77yo M who presented with fever and HYPOtension. His u/a was consistent with UTI.  Septic shock, urinary tract source, GNR in urine. Shock now resolved.   plan -narrow for GNR coverage only -keep euvolemic  H/o CVA, expressive aphasia, some degree of acute encephalopathy on presentation: now improved.  Plan: -SLP consult for nutrition -keep PANDA for now.   Best Practices PPI LMWH  Dispo  (confirmed with patient at bedside)    BABCOCK,PETE 07/17/2011, 12:03 PM        STAFF NOTE: I, Dr Lavinia Sharps have personally reviewed patient's available data, including medical history, events of note, physical examination and test results as part of my evaluation. I have discussed with NP and other care providers such as pharmacist, RN and RRT.  In addition,  I personally evaluated patient and elicited key findings of  gram-negative septic shock secondary to urinary tract infection. Specific organism yet to be identified but he is off pressors and is stable to go to the floor. At the time of dictation he is already under the regular medical bed. [facility] will be primary service starting tomorrow 07/18/2011.  Rest per NP whose note is outlined above and that I agree with

## 2011-07-17 NOTE — Progress Notes (Signed)
INITIAL ADULT NUTRITION ASSESSMENT Date: 07/17/2011   Time: 12:13 PM Reason for Assessment: low braden  ASSESSMENT: Male 77 y.o.  Dx: UTI  Hx:  Past Medical History  Diagnosis Date  . Cerebral vascular disease   . Hypotension   . Neuropathy   . Chronic pain   . Lymphedema   . Depressive disorder   . Insomnia   . Aphasia   . Hypokalemia   . Diabetes mellitus   . Anemia   . Dehydration    History reviewed. No pertinent past surgical history.  Related Meds:  Scheduled Meds:   . antiseptic oral rinse  15 mL Mouth Rinse BID  . enoxaparin  40 mg Subcutaneous Q24H  . pantoprazole sodium  40 mg Per Tube Q1200  . piperacillin-tazobactam (ZOSYN)  IV  3.375 g Intravenous Q8H  . pregabalin  75 mg Oral BID  . sodium chloride  1,000 mL Intravenous Once  . sodium chloride  25 mL/kg Intravenous Once  . sodium chloride  2,000 mL Intravenous Once  . sodium chloride  500 mL Intravenous Once  . vancomycin  1,000 mg Intravenous Q12H  . DISCONTD: pantoprazole  40 mg Oral Q1200   Continuous Infusions:   . sodium chloride 100 mL/hr at 07/17/11 0543   PRN Meds:.sodium chloride, acetaminophen (TYLENOL) oral liquid 160 mg/5 mL, DOBUTamine, norepinephrine (LEVOPHED) Adult infusion, sodium chloride   Ht: 6\' 2"  (188 cm)  Wt: 183 lb 3.2 oz (83.1 kg)  Ideal Wt: 84.8 kg % Ideal Wt: 97%  Usual Wt: unable to assess % Usual Wt:   Body mass index is 23.52 kg/(m^2).  Food/Nutrition Related Hx:  Pt from SNF with hypotension, sepsis, UTI.  Pt currently sleeping, no family at bedside.  Pt with slurred speech per RN notes NPO. Wt hx largely unknown.  Past wts range between 74-89 kg.  Labs:  CMP     Component Value Date/Time   NA 137 07/16/2011 0214   K 4.1 07/16/2011 0214   CL 105 07/16/2011 0214   CO2 24 07/16/2011 0214   GLUCOSE 79 07/16/2011 0214   BUN 38* 07/16/2011 0214   CREATININE 1.14 07/16/2011 0214   CALCIUM 8.2* 07/16/2011 0214   PROT 5.9* 07/16/2011 0214   ALBUMIN  2.6* 07/16/2011 0214   AST 23 07/16/2011 0214   ALT 15 07/16/2011 0214   ALKPHOS 108 07/16/2011 0214   BILITOT 0.6 07/16/2011 0214   GFRNONAA 59* 07/16/2011 0214   GFRAA 69* 07/16/2011 0214    CBC    Component Value Date/Time   WBC 8.2 07/16/2011 0214   RBC 4.10* 07/16/2011 0214   HGB 12.7* 07/16/2011 0214   HCT 37.2* 07/16/2011 0214   PLT 121* 07/16/2011 0214   MCV 90.7 07/16/2011 0214   MCH 31.0 07/16/2011 0214   MCHC 34.1 07/16/2011 0214   RDW 14.9 07/16/2011 0214   LYMPHSABS 0.4* 07/16/2011 0214   MONOABS 0.3 07/16/2011 0214   EOSABS 0.0 07/16/2011 0214   BASOSABS 0.0 07/16/2011 0214    CBGs: 84-137 mg/dL  Intake: NPO Output:   Intake/Output Summary (Last 24 hours) at 07/17/11 1218 Last data filed at 07/17/11 1100  Gross per 24 hour  Intake   3690 ml  Output   1698 ml  Net   1992 ml   I/O last 3 completed shifts: In: 6025 [I.V.:5500; IV Piggyback:525] Out: 2150 [Urine:2145; Stool:5] Total I/O In: 465 [I.V.:440; IV Piggyback:25] Out: 390 [Urine:390]  Having BMs.   Diet Order:  NPO  Supplements/Tube Feeding:  none at this time  IVF:    sodium chloride Last Rate: 100 mL/hr at 07/17/11 0543    Estimated Nutritional Needs:   Kcal: 2075-2320 kcal Protein: 107-124 g Fluid: >2.0 L/day  NUTRITION DIAGNOSIS: -Inadequate oral intake (NI-2.1).  Status: Ongoing  RELATED TO: omission of energy dense foods  AS EVIDENCE BY: weakness, lethargy, NPO  MONITORING/EVALUATION(Goals): 1.  Food/Beverage; diet advancement with tolerance.  EDUCATION NEEDS: -Education not appropriate at this time  INTERVENTION: 1.  Modify diet; advance diet per MD discretion to Heart Healthy  Dietitian #: [phone]  DOCUMENTATION CODES Per approved criteria  -Not Applicable    Loyce Dys [facility] 07/17/2011, 12:13 PM

## 2011-07-18 ENCOUNTER — Encounter (HOSPITAL_COMMUNITY): Payer: Self-pay | Admitting: *Deleted

## 2011-07-18 LAB — CBC
HCT: 38.3 % — ABNORMAL LOW (ref 39.0–52.0)
MCHC: 33.2 g/dL (ref 30.0–36.0)
MCV: 90.8 fL (ref 78.0–100.0)
Platelets: 107 10*3/uL — ABNORMAL LOW (ref 150–400)
RDW: 15.3 % (ref 11.5–15.5)

## 2011-07-18 LAB — BASIC METABOLIC PANEL
BUN: 18 mg/dL (ref 6–23)
CO2: 19 mEq/L (ref 19–32)
Calcium: 7.5 mg/dL — ABNORMAL LOW (ref 8.4–10.5)
Chloride: 110 mEq/L (ref 96–112)
Creatinine, Ser: 0.76 mg/dL (ref 0.50–1.35)

## 2011-07-18 LAB — URINE CULTURE: Culture  Setup Time: 201212170933

## 2011-07-18 MED ORDER — SODIUM CHLORIDE 0.9 % IJ SOLN
10.0000 mL | INTRAMUSCULAR | Status: DC | PRN
  Administered 2011-07-18 (×2): 10 mL

## 2011-07-18 MED ORDER — ENSURE CLINICAL ST REVIGOR PO LIQD
237.0000 mL | Freq: Three times a day (TID) | ORAL | Status: DC
  Administered 2011-07-18 – 2011-07-20 (×6): 237 mL via ORAL
  Administered 2011-07-20: 08:00:00 via ORAL

## 2011-07-18 NOTE — Progress Notes (Signed)
Subjective: Feeling better. No fever, no CP, no SOB. Appetite still a little off.   Objective: Vital signs in last 24 hours: Temp:  [97.9 F (36.6 C)-100 F (37.8 C)] 99.6 F (37.6 C) (12/19 0537) Pulse Rate:  [77-155] 110  (12/19 0537) Resp:  [15-21] 20  (12/19 0537) BP: (101-134)/(59-94) 134/94 mmHg (12/19 0537) SpO2:  [90 %-96 %] 90 % (12/19 0537) Weight change:  Last BM Date: 07/16/11  Intake/Output from previous day: 12/18 0701 - 12/19 0700 In: 3037.5 [P.O.:480; I.V.:2420; IV Piggyback:137.5] Out: 1182 [Urine:1180; Stool:2] Total I/O In: 120 [P.O.:120] Out: -    Physical Exam: General: Alert, awake, oriented x3, in no acute distress. HEENT: No bruits, no goiter. Heart: Regular rate and rhythm, without murmurs, rubs, gallops. Lungs: Clear to auscultation bilaterally. Abdomen: Soft, nontender, nondistended, positive bowel sounds. Extremities: No clubbing cyanosis or edema with positive pedal pulses. Neuro: CN grossly intact, no new deficit on exam. Patient hemiplegic right side.  Lab Results: Basic Metabolic Panel:  Basename 07/18/11 0525 07/16/11 0214  NA 140 137  K 3.4* 4.1  CL 110 105  CO2 19 24  GLUCOSE 69* 79  BUN 18 38*  CREATININE 0.76 1.14  CALCIUM 7.5* 8.2*  MG -- --  PHOS -- --   Liver Function Tests:  [facility] 07/16/11 0214  AST 23  ALT 15  ALKPHOS 108  BILITOT 0.6  PROT 5.9*  ALBUMIN 2.6*    Basename 07/16/11 0214  LIPASE 9*  AMYLASE --   CBC:  Basename 07/18/11 0525 07/16/11 0214  WBC 4.9 8.2  NEUTROABS -- 7.5  HGB 12.7* 12.7*  HCT 38.3* 37.2*  MCV 90.8 90.7  PLT 107* 121*   Cardiac Enzymes:  Basename 07/16/11 0515  CKTOTAL 282*  CKMB 2.9  CKMBINDEX --  TROPONINI <0.30   Coagulation:  Basename 07/16/11 0857  LABPROT 16.7*  INR 1.33    Misc. Labs:  Recent Results (from the past 240 hour(s))  URINE CULTURE     Status: Normal   Collection Time   07/16/11  1:37 AM      Component Value Range Status Comment   Specimen Description URINE, CATHETERIZED   Final    Special Requests NONE   Final    Setup Time [id number]   Final    Colony Count >=100,000 COLONIES/ML   Final    Culture PROTEUS MIRABILIS   Final    Report Status 07/18/2011 FINAL   Final    Organism ID, Bacteria PROTEUS MIRABILIS   Final   CULTURE, BLOOD (ROUTINE X 2)     Status: Normal (Preliminary result)   Collection Time   07/16/11  2:14 AM      Component Value Range Status Comment   Specimen Description BLOOD LEFT ARM   Final    Special Requests BOTTLES DRAWN AEROBIC AND ANAEROBIC 7CC   Final    Setup Time [id number]   Final    Culture     Final    Value:        BLOOD CULTURE RECEIVED NO GROWTH TO DATE CULTURE WILL BE HELD FOR 5 DAYS BEFORE ISSUING A FINAL NEGATIVE REPORT   Report Status PENDING   Incomplete   MRSA PCR SCREENING     Status: Normal   Collection Time   07/16/11  2:29 PM      Component Value Range Status Comment   MRSA by PCR NEGATIVE  NEGATIVE  Final     Studies/Results: Dg Abd 1 View  07/16/2011  *  RADIOLOGY REPORT*  Clinical Data: Feeding tube placement.  ABDOMEN - 1 VIEW  Comparison: 11/27/2004  Findings: Feeding tube coils in the fundus of the stomach.  There is a nonobstructive bowel gas pattern.  IMPRESSION: Feeding tube coils in the fundus of the stomach.  Original Report Authenticated By: Cyndie Chime, M.D.    Medications: Scheduled Meds:   . antiseptic oral rinse  15 mL Mouth Rinse BID  . enoxaparin  40 mg Subcutaneous Q24H  . feeding supplement  237 mL Oral TID WC  . pantoprazole sodium  40 mg Per Tube Q1200  . piperacillin-tazobactam (ZOSYN)  IV  3.375 g Intravenous Q8H  . pregabalin  75 mg Oral BID  . DISCONTD: sodium chloride  25 mL/kg Intravenous Once   Continuous Infusions:   . DISCONTD: sodium chloride 100 mL/hr at 07/17/11 2017   PRN Meds:.sodium chloride  Assessment/Plan: 1-Proteus mirabilis UTI causing early sepsis: Continue zosyn; no fever and BP stable now w/o pressors or  IVF's. Will treat with one more days with iv antibiotics and arrange for discharge tomorrow back to facility on bactrim to finish treatment.   2-dysphagia as late effect of cerebrovascular accident: continue dysphagia 3 diet, appreciate SLP eval and assistance/recommendations.  3-Encephalopathy acute: due to #1; resolved. Now back to baseline.  4-protein calorie-malnutrition: Will start ensure TID.  5-GERD: continue PPI.  6-DVT: continue lovenox.  7-Low vit D: continue weekly high dose vit D as an outpatient.  8-neuropathy: continue lyrica.     LOS: 2 days   Khaniya Tenaglia [facility] [phone]  07/18/2011, 1:44 PM

## 2011-07-18 NOTE — Progress Notes (Signed)
UR COMPLETED  

## 2011-07-18 NOTE — Progress Notes (Signed)
Speech Pathology: Dysphagia Treatment Note    [id number]  Patient was observed with : Regular (crackers, icecream) and Thin liquids.  Patient was noted to have s/s of aspiration : No:, RN did report coughing with liquid protonix, ? If secondary to medication, pt denied problems swallowing today.  Educated pt to consume water with meals due to pH neutral status   Lung Sounds:  Clear to auscultation Temperature: febrile, has been febrile since admit  Intake of diet 20-45%  Patient required: no cues to consistently follow precautions/strategies, pt takes small bites/sip  Clinical Impression: Suspect swallow function is at baseline.  Pt aphasic. SLP provided communication board, pt demonstrated adequate usage.  SLP phoned SNF to inquire regarding possibility of electronic augmentative communication device for pt, SLP who had seen pt in past was off today, so left message with another SLP.  Printed and provided pt with copies of information from [place] assistive technology program.    Recommendations:   Continue current diet, no further acute SLP indicated.  Recommend follow up SLP at SNF to research possible augmentative communication device for pt.  Pt verbalized understanding to information provided.   Pain:   none Intervention Required:   No  Goals: All Goals Met   Dollene Cleveland, MS [facility] SLP [phone]

## 2011-07-18 NOTE — Progress Notes (Signed)
Per MD order, central line removed. IV cathter intact. Vaseline pressure gauze to site, pressure held x 5 min, no bleeding to site. Pt instructed to keep dressing CDI x 24hours, if bleeding occurs hold pressure, if bleeding does not stop contact nurse and hold pressure. Pt verbalized understanding and did not have any questions Consuello Masse

## 2011-07-19 MED ORDER — NAPHAZOLINE HCL 0.1 % OP SOLN
2.0000 [drp] | Freq: Four times a day (QID) | OPHTHALMIC | Status: DC | PRN
  Administered 2011-07-19: 2 [drp] via OPHTHALMIC
  Filled 2011-07-19: qty 15

## 2011-07-19 MED ORDER — SULFAMETHOXAZOLE-TMP DS 800-160 MG PO TABS
1.0000 | ORAL_TABLET | Freq: Two times a day (BID) | ORAL | Status: DC
  Administered 2011-07-19 – 2011-07-20 (×2): 1 via ORAL
  Filled 2011-07-19 (×6): qty 1

## 2011-07-19 MED ORDER — LISINOPRIL 5 MG PO TABS
5.0000 mg | ORAL_TABLET | Freq: Every day | ORAL | Status: DC
  Administered 2011-07-19 – 2011-07-20 (×2): 5 mg via ORAL
  Filled 2011-07-19 (×3): qty 1

## 2011-07-19 NOTE — Progress Notes (Signed)
Pt's B/P continued to be high,155/106, pulse 118 @ 2230pm, 165/96, pulse 110 @2340 , 154/95, pulse 101 @0448 . The on-call NP Westley Hummer. Was notified. Please see orders in Epic for details.

## 2011-07-19 NOTE — Progress Notes (Addendum)
Subjective: Feeling better. No fever, no CP, no SOB. Still no eating much; patient also complaining of left eye irritation/itching.   Objective: Vital signs in last 24 hours: Temp:  [98.3 F (36.8 C)-98.9 F (37.2 C)] 98.5 F (36.9 C) (12/20 1430) Pulse Rate:  [91-118] 91  (12/20 1430) Resp:  [18-20] 18  (12/20 1430) BP: (138-169)/(93-106) 138/93 mmHg (12/20 1430) SpO2:  [91 %-96 %] 91 % (12/20 1430) Weight change:  Last BM Date: 07/16/11  Intake/Output from previous day: 12/19 0701 - 12/20 0700 In: 340 [P.O.:240; IV Piggyback:100] Out: 2400 [Urine:2400] Total I/O In: 120 [P.O.:120] Out: -    Physical Exam: General: Alert, awake, oriented x3, in no acute distress. HEENT: No bruits, no goiter. Left eye redness, no discharge. Heart: Regular rate and rhythm, without murmurs, rubs, gallops. Lungs: Clear to auscultation bilaterally. Abdomen: Soft, nontender, nondistended, positive bowel sounds. Extremities: No clubbing cyanosis or edema with positive pedal pulses. Neuro: CN grossly intact, no new deficit on exam. Patient hemiplegic right side.  Lab Results: Basic Metabolic Panel:  [facility] 07/18/11 0525  NA 140  K 3.4*  CL 110  CO2 19  GLUCOSE 69*  BUN 18  CREATININE 0.76  CALCIUM 7.5*  MG --  PHOS --   CBC:  Basename 07/18/11 0525  WBC 4.9  NEUTROABS --  HGB 12.7*  HCT 38.3*  MCV 90.8  PLT 107*    Misc. Labs:  Recent Results (from the past 240 hour(s))  URINE CULTURE     Status: Normal   Collection Time   07/16/11  1:37 AM      Component Value Range Status Comment   Specimen Description URINE, CATHETERIZED   Final    Special Requests NONE   Final    Setup Time [id number]   Final    Colony Count >=100,000 COLONIES/ML   Final    Culture PROTEUS MIRABILIS   Final    Report Status 07/18/2011 FINAL   Final    Organism ID, Bacteria PROTEUS MIRABILIS   Final   CULTURE, BLOOD (ROUTINE X 2)     Status: Normal (Preliminary result)   Collection Time   07/16/11  2:14 AM      Component Value Range Status Comment   Specimen Description BLOOD LEFT ARM   Final    Special Requests BOTTLES DRAWN AEROBIC AND ANAEROBIC 7CC   Final    Setup Time [id number]   Final    Culture     Final    Value:        BLOOD CULTURE RECEIVED NO GROWTH TO DATE CULTURE WILL BE HELD FOR 5 DAYS BEFORE ISSUING A FINAL NEGATIVE REPORT   Report Status PENDING   Incomplete   MRSA PCR SCREENING     Status: Normal   Collection Time   07/16/11  2:29 PM      Component Value Range Status Comment   MRSA by PCR NEGATIVE  NEGATIVE  Final     Studies/Results: No results found.  Medications: Scheduled Meds:    . antiseptic oral rinse  15 mL Mouth Rinse BID  . enoxaparin  40 mg Subcutaneous Q24H  . feeding supplement  237 mL Oral TID WC  . lisinopril  5 mg Oral Daily  . pantoprazole sodium  40 mg Per Tube Q1200  . pregabalin  75 mg Oral BID  . sulfamethoxazole-trimethoprim  1 tablet Oral Q12H  . DISCONTD: piperacillin-tazobactam (ZOSYN)  IV  3.375 g Intravenous Q8H   Continuous Infusions:  PRN  Meds:.naphazoline, sodium chloride  Assessment/Plan: 1-Proteus mirabilis UTI causing early sepsis: Will discontinue IV Zosyn, start Bactrim double strength twice a day by mouth to complete antibiotic regimen. Will discontinue Foley and arrange to discharge the patient back to nursing home tomorrow.  2-Dysphagia as late effect of cerebrovascular accident: continue dysphagia 3 diet.  3-Encephalopathy acute: Resolve.  4-protein calorie-malnutrition: Continue ensure TID.  5-GERD: continue PPI.  6-DVT: continue lovenox.  7-Low vit D: continue weekly high dose vit D as an outpatient.  8-neuropathy: continue lyrica.  9-left eye irritation: Most likely secondary to dry and; no signs of bacterial infection or drainage. Will use Visine as needed for comfort.     LOS: 3 days   Bradley Boyle [facility] [phone]  07/19/2011, 5:38 PM

## 2011-07-19 NOTE — Progress Notes (Signed)
ANTIBIOTIC CONSULT NOTE - FOLLOW UP  Pharmacy Consult for Zosyn Indication: Proteus UTI/Sepsis  No Known Allergies  Patient Measurements: Height: 6\' 2"  (188 cm) Weight: 183 lb 3.2 oz (83.1 kg) IBW/kg (Calculated) : 82.2  Adjusted Body Weight:  Vital Signs: Temp: 98.9 F (37.2 C) (12/20 0600) Temp src: Oral (12/20 0600) BP: 169/102 mmHg (12/20 0600) Pulse Rate: 108  (12/20 0600) Intake/Output from previous day: 12/19 0701 - 12/20 0700 In: 340 [P.O.:240; IV Piggyback:100] Out: 2400 [Urine:2400] Intake/Output from this shift:    Labs:  [facility] 07/18/11 0525  WBC 4.9  HGB 12.7*  PLT 107*  LABCREA --  CREATININE 0.76   Estimated Creatinine Clearance: 87.1 ml/min (by C-G formula based on Cr of 0.76).   Microbiology: Recent Results (from the past 720 hour(s))  URINE CULTURE     Status: Normal   Collection Time   07/16/11  1:37 AM      Component Value Range Status Comment   Specimen Description URINE, CATHETERIZED   Final    Special Requests NONE   Final    Setup Time [id number]   Final    Colony Count >=100,000 COLONIES/ML   Final    Culture PROTEUS MIRABILIS   Final    Report Status 07/18/2011 FINAL   Final    Organism ID, Bacteria PROTEUS MIRABILIS   Final   CULTURE, BLOOD (ROUTINE X 2)     Status: Normal (Preliminary result)   Collection Time   07/16/11  2:14 AM      Component Value Range Status Comment   Specimen Description BLOOD LEFT ARM   Final    Special Requests BOTTLES DRAWN AEROBIC AND ANAEROBIC 7CC   Final    Setup Time [id number]   Final    Culture     Final    Value:        BLOOD CULTURE RECEIVED NO GROWTH TO DATE CULTURE WILL BE HELD FOR 5 DAYS BEFORE ISSUING A FINAL NEGATIVE REPORT   Report Status PENDING   Incomplete   MRSA PCR SCREENING     Status: Normal   Collection Time   07/16/11  2:29 PM      Component Value Range Status Comment   MRSA by PCR NEGATIVE  NEGATIVE  Final       Assessment:  Day #4 Zosyn 3.375gm IV q8h for  Proteus mirabilis UTI/Sepsis  Renal function is stable, therefore appropriate to continue current dose  Plan to change to PO Bactrim before discharge today  Goal of Therapy:  Eradication of infection  Plan:   Continue current regimen for now  Anticipate discharge on PO antibiotics today  Rollene Fare 07/19/2011,8:59 AM Pager: [phone]

## 2011-07-20 MED ORDER — LISINOPRIL 10 MG PO TABS: 10.0000 mg | ORAL_TABLET | Freq: Every day | ORAL | Status: DC

## 2011-07-20 MED ORDER — ENSURE CLINICAL ST REVIGOR PO LIQD: 237.0000 mL | Freq: Three times a day (TID) | ORAL | Status: AC

## 2011-07-20 MED ORDER — SULFAMETHOXAZOLE-TMP DS 800-160 MG PO TABS: 1.0000 | ORAL_TABLET | Freq: Two times a day (BID) | ORAL | Status: AC

## 2011-07-20 MED ORDER — NAPHAZOLINE HCL 0.1 % OP SOLN: 2.0000 [drp] | Freq: Four times a day (QID) | OPHTHALMIC | Status: AC | PRN

## 2011-07-20 NOTE — Discharge Summary (Signed)
Physician Discharge Summary  Patient ID: Bradley Boyle MRN: [id number] DOB/AGE: 10-05-1929 77 y.o.  Admit date: 07/16/2011 Discharge date: 07/20/2011  Primary Care Physician:  Kimber Relic, MD, MD   Discharge Diagnoses:   1-septic shock secondary to Proteus mirabilis UTI 2-Proteus mirabilis urinary tract infection 3-toxic metabolic encephalopathy secondary to #1 and #2 4-hypertension 5-anemia of chronic disease 6-Dysphagia 7-Hx of stroke with residual aphasia and right side hemiplegia. 8-protein calorie malnutrition 9-neuropathy 10-gastroesophageal reflux disease 11-COPD 12-Vit D deficiency  Present on Admission:  .Septic shock .Urinary tract bacterial infections .Encephalopathy acute .Anemia .Physical deconditioning  Current Discharge Medication List    START taking these medications   Details  feeding supplement (ENSURE CLINICAL STRENGTH) LIQD Take 237 mLs by mouth 3 (three) times daily with meals.    lisinopril (PRINIVIL,ZESTRIL) 10 MG tablet Take 1 tablet (10 mg total) by mouth daily.    naphazoline (NAPHCON) 0.1 % ophthalmic solution Place 2 drops into the left eye 4 (four) times daily as needed (Irritation and itching). Qty: 15 mL    sulfamethoxazole-trimethoprim (BACTRIM DS) 800-160 MG per tablet Take 1 tablet by mouth every 12 (twelve) hours.      CONTINUE these medications which have NOT CHANGED   Details  acetaminophen (TYLENOL) 325 MG tablet 650 mg every 6 (six) hours as needed. Fever pain     albuterol (PROVENTIL) (2.5 MG/3ML) 0.083% nebulizer solution Take 2.5 mg by nebulization every 6 (six) hours as needed. Shortness of breath     Multiple Vitamin (MULITIVITAMIN WITH MINERALS) TABS Take 1 tablet by mouth daily.      pregabalin (LYRICA) 75 MG capsule Take 75 mg by mouth 3 (three) times daily.      ranitidine (ZANTAC) 150 MG tablet Take 150 mg by mouth at bedtime.      vitamin C (ASCORBIC ACID) 500 MG tablet Take 500 mg by mouth daily.      Vitamin D, Ergocalciferol, (DRISDOL) 50000 UNITS CAPS Take 50,000 Units by mouth every 7 (seven) days.        STOP taking these medications     moxifloxacin (AVELOX) 400 MG tablet         Disposition and Follow-up:  The patient has been discharged in a stable and improved condition: No complaining of any dysuria, able to void without any difficulty, stable vital signs and w/o any chest pain or shortness of breath. She has been instructed to take his medication as prescribed and to arrange followup with PCP over the next 7-10 days. Will be important to make sure that the patient is eating and drinking properly in order to prevent dehydration. His blood pressure has been elevated throughout this hospitalization and he was started on lisinopril. During his followup will be important to reevaluate patient's blood pressure and also to check a basic metabolic panel in order to evaluate kidney function after initiation of ACE inhibitors. Patient was instructed to follow a low-sodium heart healthy diet and also mechanical soft with thin liquids in order to help reducing the risk of aspiration due to the residual dysphagia from his CVA.  Consults:   CCM   Significant Diagnostic Studies:  Dg Abd 1 View  07/16/2011  *RADIOLOGY REPORT*  Clinical Data: Feeding tube placement.  ABDOMEN - 1 VIEW  Comparison: 11/27/2004  Findings: Feeding tube coils in the fundus of the stomach.  There is a nonobstructive bowel gas pattern.  IMPRESSION: Feeding tube coils in the fundus of the stomach.  Original Report Authenticated By: Caryn Bee  G. Kearney Hard, M.D.   Dg Chest Portable 1 View  07/16/2011  *RADIOLOGY REPORT*  Clinical Data: Confirm central line placement.  PORTABLE CHEST - 1 VIEW  Comparison: 07/16/2011  Findings: Interval left IJ central venous catheter placement with tip projecting over the proximal SVC.  Otherwise no interval change.  Interstitial prominence.  Prominent cardiomediastinal contours.  No  pneumothorax.  Elevated right hemidiaphragm. Osteopenia.  Degenerative changes of the left shoulder.  IMPRESSION: Interval left IJ catheter placement with tip projecting over the proximal SVC. No pneumothorax.  Unchanged interstitial prominence.  Original Report Authenticated By: Waneta Martins, M.D.   Dg Chest Port 1 View  07/16/2011  *RADIOLOGY REPORT*  Clinical Data: Fever, hypotension, cough  PORTABLE CHEST - 1 VIEW  Comparison: 01/03/2009  Findings: The patient is rotated.  Increased interstitial markings, possibly reflecting mild interstitial edema.  No pleural effusion or pneumothorax.  The heart is top normal in size.  Degenerative changes of the visualized thoracolumbar spine.  IMPRESSION: Possible mild interstitial edema.  Original Report Authenticated By: Charline Bills, M.D.    Brief H and P: For complete details please refer to admission H and P, but in brief 77 year old male with a past medical history significant for hypertension, cerebrovascular accident (with residual dysphagia and right side hemiplegia); who came into the hospital from nursing home where she resides secondary to hypertension, lower abdominal pain and findings of acute sepsis due to the UTI.  Hospital Course:  1-septic shock secondary to Proteus mirabilis UTI: Patient was initially treated by CCM in the ICU settings, after he was stable and responded to treatment patient was transfer to internal medicine [facility] in order to continue providing treatment. Patient received initially IV Zosyn and vancomycin while waiting on cultures/sensitivity of the urinalysis. Once the UA demonstrated Proteus mirabilis sensitive to Bactrim patient medications were transition to by mouth and after being afebrile for > than 48 hours; patient was transferred back to the facility to finish antibiotic therapy. At this point his no having any dysuria, no abdominal pain, is able to void without any difficulties and is tolerating  his PO antibiotics w/o any difficulties.  2-Proteus mirabilis urinary tract infection: As mentioned above, urinary culture demonstrated Proteus mirabilis infection sensitive to Bactrim. Patient received 3 days of IV antibiotics with a subsequent change to Bactrim by mouth he wanted to take for 7 more days and complete a total of 10 days antibiotic regimen. At this point patient is not febrile, WBC's within normal limits and patient is not experiencing any dysuria.  3-toxic metabolic encephalopathy secondary to #1 and #2: Patient was initially having AMS most likely due to acute infection. Wound infection was treated mental status returned to baseline and has remained appropriate since then.  4-hypertension: Patient instructed to follow a low-sodium diet and has been started on lisinopril 10 mg by mouth daily. Further adjustment on his antihypertensive drugs and/or new medications to be determined by PCP as an outpatient.  5-anemia of chronic disease: Hemoglobin remained stable throughout this hospitalization no transfusions were required.  6-Dysphagia: Speech therapy evaluated patient during this hospitalization and recommended a soft mechanical diet with thin liquids. Patient has been advised to follow these recommendations.  7-neuropathy: Continue Lyrica.  8-gastroesophageal reflux disease: Continue Zantac.  9-COPD: Stable. Patient currently no wheezing and having good oxygen saturation on room air. He will continue using albuterol as needed for shortness of breath/wheezing.  10-Vit D deficiency: Continue high-dose vitamin D weekly as instructed/prescribed prior to admission.  The rest of the patient's medical problems remains stable and no changes were made to his regimen. He will follow with PCP as an outpatient for further adjustment of his medications as needed.  Time spent on Discharge: 50 minutes  Signed: Tamorah Hada 07/20/2011, 3:10 PM

## 2011-07-22 LAB — CULTURE, BLOOD (ROUTINE X 2)
Culture  Setup Time: 201212170932
Culture: NO GROWTH

## 2012-10-14 ENCOUNTER — Non-Acute Institutional Stay (SKILLED_NURSING_FACILITY): Payer: Medicare Other | Admitting: Adult Health

## 2012-10-14 DIAGNOSIS — I6350 Cerebral infarction due to unspecified occlusion or stenosis of unspecified cerebral artery: Secondary | ICD-10-CM

## 2012-10-14 DIAGNOSIS — I10 Essential (primary) hypertension: Secondary | ICD-10-CM

## 2012-10-14 DIAGNOSIS — I639 Cerebral infarction, unspecified: Secondary | ICD-10-CM

## 2012-10-14 DIAGNOSIS — K219 Gastro-esophageal reflux disease without esophagitis: Secondary | ICD-10-CM

## 2012-10-14 DIAGNOSIS — E559 Vitamin D deficiency, unspecified: Secondary | ICD-10-CM

## 2012-10-14 DIAGNOSIS — G894 Chronic pain syndrome: Secondary | ICD-10-CM

## 2012-10-14 NOTE — Progress Notes (Signed)
Subjective:     Patient ID: Bradley Boyle, male   DOB: [birth year], 79 y.o.   MRN: [id number] Chief Complaint  Patient presents with  . Medical Managment of Chronic Issues    HPI CVA (cerebral vascular accident) He is presently stable is not taking medications at this time.   Essential hypertension, benign His blood pressure is stable is taking lisinopril 10 mg dialy   GERD (gastroesophageal reflux disease) He is stable is taking zantac 150 mg nightly   Chronic pain syndrome His pain is being adequately managed is taking lyrica 75 mg three times daily takes vicodin 5/325 mg every 4 hours as needed for apin   Unspecified vitamin D deficiency Is taking vitamin d 1000 units daily    Past Medical History  Diagnosis Date  . Cerebral vascular disease   . Hypotension   . Neuropathy   . Chronic pain   . Lymphedema   . Depressive disorder   . Insomnia   . Aphasia   . Hypokalemia   . Diabetes mellitus   . Anemia   . Dehydration    No past surgical history on file.  past procedures: 08-28-12: right wrist x-ray: no acute abnormalities 08-28-12: right hand x-ray: flex contractures present    Review of Systems  Unable to perform ROS  Filed Vitals:   10/20/12 1213  BP: 124/62  Pulse: 60  Weight: 160 lb (72.576 kg)       Objective:   Physical Exam  Constitutional: He appears well-developed and well-nourished.  Neck: Neck supple.  Cardiovascular: Normal rate, normal heart sounds and intact distal pulses.   Pulmonary/Chest: Effort normal and breath sounds normal.  Abdominal: Soft. Bowel sounds are normal.  Musculoskeletal: He exhibits no edema.  Has right sided weakness present; has left facial droop  Neurological: He is alert.  Skin: Skin is warm.  Psychiatric: His behavior is normal.   Lab reviewed: 09-02-12: uric acid 7.2     Assessment:    Hypertension; cva; chronic pain; gerd; vit d def ;    Plan:     Will not make any medication changes at this time;  will continue his current regimen and will continue to monitor his status; will make changes in the future as indicated.

## 2012-10-20 ENCOUNTER — Encounter: Payer: Self-pay | Admitting: Adult Health

## 2012-10-20 DIAGNOSIS — K219 Gastro-esophageal reflux disease without esophagitis: Secondary | ICD-10-CM | POA: Insufficient documentation

## 2012-10-20 DIAGNOSIS — I10 Essential (primary) hypertension: Secondary | ICD-10-CM

## 2012-10-20 DIAGNOSIS — E559 Vitamin D deficiency, unspecified: Secondary | ICD-10-CM | POA: Insufficient documentation

## 2012-10-20 DIAGNOSIS — I639 Cerebral infarction, unspecified: Secondary | ICD-10-CM | POA: Insufficient documentation

## 2012-10-20 DIAGNOSIS — G894 Chronic pain syndrome: Secondary | ICD-10-CM | POA: Insufficient documentation

## 2012-10-20 HISTORY — DX: Essential (primary) hypertension: I10

## 2012-10-20 HISTORY — DX: Cerebral infarction, unspecified: I63.9

## 2012-10-20 HISTORY — DX: Gastro-esophageal reflux disease without esophagitis: K21.9

## 2012-10-20 NOTE — Assessment & Plan Note (Signed)
He is presently stable is not taking medications at this time.

## 2012-10-20 NOTE — Assessment & Plan Note (Signed)
Is taking vitamin d 1000 units daily  

## 2012-10-20 NOTE — Assessment & Plan Note (Signed)
His blood pressure is stable is taking lisinopril 10 mg dialy

## 2012-10-20 NOTE — Assessment & Plan Note (Signed)
His pain is being adequately managed is taking lyrica 75 mg three times daily takes vicodin 5/325 mg every 4 hours as needed for apin

## 2012-10-20 NOTE — Assessment & Plan Note (Signed)
He is stable is taking zantac 150 mg nightly

## 2012-12-05 ENCOUNTER — Other Ambulatory Visit: Payer: Self-pay | Admitting: *Deleted

## 2012-12-05 MED ORDER — HYDROCODONE-ACETAMINOPHEN 5-325 MG PO TABS: ORAL_TABLET | ORAL | Status: DC

## 2012-12-16 ENCOUNTER — Encounter: Payer: Self-pay | Admitting: Adult Health

## 2012-12-16 ENCOUNTER — Non-Acute Institutional Stay (SKILLED_NURSING_FACILITY): Payer: Medicare Other | Admitting: Adult Health

## 2012-12-16 DIAGNOSIS — I6350 Cerebral infarction due to unspecified occlusion or stenosis of unspecified cerebral artery: Secondary | ICD-10-CM

## 2012-12-16 DIAGNOSIS — I639 Cerebral infarction, unspecified: Secondary | ICD-10-CM

## 2012-12-16 DIAGNOSIS — I10 Essential (primary) hypertension: Secondary | ICD-10-CM

## 2012-12-16 DIAGNOSIS — G9340 Encephalopathy, unspecified: Secondary | ICD-10-CM

## 2012-12-16 DIAGNOSIS — G894 Chronic pain syndrome: Secondary | ICD-10-CM

## 2012-12-16 DIAGNOSIS — K219 Gastro-esophageal reflux disease without esophagitis: Secondary | ICD-10-CM

## 2012-12-16 NOTE — Progress Notes (Signed)
Patient ID: Bradley Boyle, male   DOB: 1930/07/21, 79 y.o.   MRN: [id number]  [facility]  No Known Allergies  Chief Complaint  Patient presents with  . Medical Managment of Chronic Issues    HPI He is being seen for the management of his chronic illnesses. There are no reports of behavorial issues present or other concerns by the nursing staff at this time. He is unable to fully participate in the hpi or ros. Overall his status remains unchanged in the recent past.   Past Medical History  Diagnosis Date  . Cerebral vascular disease   . Hypotension   . Neuropathy   . Chronic pain   . Lymphedema   . Depressive disorder   . Insomnia   . Aphasia   . Hypokalemia   . Diabetes mellitus   . Anemia   . Dehydration     No past surgical history on file.  Filed Vitals:   12/16/12 1532  BP: 132/70  Pulse: 70  Height: 5\' 8"  (1.727 m)  Weight: 162 lb (73.483 kg)   MEDICATIONS  Lisinopril 10 mg daily Zantac 150 mg daily lyrica 75 mg three times daily vicodin 5/325 mg every 4 hours as needed Vit d 1000 units daily  SIGNIFICANT STUDIES   08-28-12: right wrist x-ray: no acute abnormalities 08-28-12: right hand x-ray: flex contractures present  11-12-12: left lower extremity doppler: negative for dvt    Lab reviewed: 09-02-12: uric acid 7.2   Review of Systems  Unable to perform ROS     Physical Exam  Constitutional: He appears well-developed and well-nourished.  Neck: Neck supple.  Cardiovascular: Normal rate, normal heart sounds and intact distal pulses.   Pulmonary/Chest: Effort normal and breath sounds normal.  Abdominal: Soft. Bowel sounds are normal.  Musculoskeletal: He exhibits no edema.  Has right sided weakness present; has left facial droop  Neurological: He is alert.  Skin: Skin is warm.  Psychiatric: His behavior is normal.   ASSESSMENT/PLAN  1. Hypertension: is stable will continue lisinopril 10 mg daily and will monitor  2. Chronic pain: no  indications or complaints of pain present will continue lyrica 75 mg three times daily and vicodin 5.325 mg every 4 hours as needed  3. Gerd: will continue zantac 150 mg daily  4. Cva: no significant change in his status will not make changes at this time will continue to monitor his status   Will check cbc; cmp; vit d next draw

## 2013-02-11 ENCOUNTER — Other Ambulatory Visit: Payer: Self-pay | Admitting: Geriatric Medicine

## 2013-02-11 MED ORDER — PREGABALIN 75 MG PO CAPS: 75.0000 mg | ORAL_CAPSULE | Freq: Three times a day (TID) | ORAL | Status: DC

## 2013-02-18 ENCOUNTER — Non-Acute Institutional Stay (SKILLED_NURSING_FACILITY): Payer: Medicare Other | Admitting: Internal Medicine

## 2013-02-18 DIAGNOSIS — I10 Essential (primary) hypertension: Secondary | ICD-10-CM

## 2013-02-18 DIAGNOSIS — R0981 Nasal congestion: Secondary | ICD-10-CM

## 2013-02-18 DIAGNOSIS — J3489 Other specified disorders of nose and nasal sinuses: Secondary | ICD-10-CM

## 2013-02-18 DIAGNOSIS — K219 Gastro-esophageal reflux disease without esophagitis: Secondary | ICD-10-CM

## 2013-02-18 DIAGNOSIS — I69920 Aphasia following unspecified cerebrovascular disease: Secondary | ICD-10-CM

## 2013-02-18 DIAGNOSIS — E559 Vitamin D deficiency, unspecified: Secondary | ICD-10-CM

## 2013-02-18 DIAGNOSIS — G894 Chronic pain syndrome: Secondary | ICD-10-CM

## 2013-02-18 DIAGNOSIS — E46 Unspecified protein-calorie malnutrition: Secondary | ICD-10-CM

## 2013-02-18 NOTE — Progress Notes (Signed)
Patient ID: Bradley Boyle, male   DOB: [birth year], 79 y.o.   MRN: [id number] Location:   [facility]  Provider:  Gwenith Spitz. Renato Gails, D.O., C.M.D.  Code Status:  DNR  Chief Complaint: med mgt chronic diseases  HPI: 79 yo male long term care resident with ongoing tobacco abuse, prior stroke with aphasia, HTN, GERD, anemia and vitamin D deficiency was seen for medical mgt of his chronic diseases.  He c/o nasal congestion and requests nasal spray for this.    Review of Systems:  Review of Systems  Constitutional: Negative for fever.  HENT: Positive for congestion.   Eyes: Negative for blurred vision.  Respiratory: Positive for cough and sputum production. Negative for shortness of breath.        Chronic, smoking  Cardiovascular: Negative for chest pain and leg swelling.  Gastrointestinal: Positive for heartburn. Negative for constipation.  Genitourinary: Negative for dysuria.  Musculoskeletal: Negative for joint pain.  Skin: Negative for rash.  Neurological: Positive for tingling, sensory change, speech change and focal weakness.       Prior stroke with aphasia  Endo/Heme/Allergies: Does not bruise/bleed easily.  Psychiatric/Behavioral: Negative for memory loss.     Medications: Patient's Medications  New Prescriptions   No medications on file  Previous Medications   ACETAMINOPHEN (TYLENOL) 325 MG TABLET    650 mg every 6 (six) hours as needed. Fever pain    ALBUTEROL (PROVENTIL) (2.5 MG/3ML) 0.083% NEBULIZER SOLUTION    Take 2.5 mg by nebulization every 6 (six) hours as needed. Shortness of breath    CHOLECALCIFEROL (VITAMIN D) 1000 UNITS TABLET    Take 1,000 Units by mouth daily.   FEEDING SUPPLEMENT (ENSURE CLINICAL STRENGTH) LIQD    Take 237 mLs by mouth 3 (three) times daily with meals.   HYDROCODONE-ACETAMINOPHEN (NORCO/VICODIN) 5-325 MG PER TABLET    Take one tablet every 4 hours as needed for pain   LISINOPRIL (PRINIVIL,ZESTRIL) 10 MG TABLET    Take 10 mg by  mouth daily.   MULTIPLE VITAMIN (MULITIVITAMIN WITH MINERALS) TABS    Take 1 tablet by mouth daily.     PREGABALIN (LYRICA) 75 MG CAPSULE    Take 1 capsule (75 mg total) by mouth 3 (three) times daily.   RANITIDINE (ZANTAC) 150 MG TABLET    Take 150 mg by mouth at bedtime.     VITAMIN C (ASCORBIC ACID) 500 MG TABLET    Take 500 mg by mouth daily.     VITAMIN D, ERGOCALCIFEROL, (DRISDOL) 50000 UNITS CAPS    Take 50,000 Units by mouth every 7 (seven) days.    Modified Medications   No medications on file  Discontinued Medications   No medications on file    Physical Exam: There were no vitals filed for this visit. Physical Exam  Constitutional: He is oriented to person, place, and time. He appears well-developed and well-nourished. No distress.  HENT:  Head: Normocephalic and atraumatic.  Eyes: EOM are normal. Pupils are equal, round, and reactive to light.  Cardiovascular: Normal rate, regular rhythm, normal heart sounds and intact distal pulses.   Pulmonary/Chest: Effort normal.  Coarse rhonchi throughout (baseline)  Abdominal: Soft. Bowel sounds are normal. He exhibits no distension and no mass. There is no tenderness.  Neurological: He is alert and oriented to person, place, and time.  Some aphasia  Skin: Skin is warm and dry.  Psychiatric: He has a normal mood and affect.   Labs reviewed: Facility labs 01/05/13:  Na 137, K 4.9, BUN 45, cr 0.96, alb 3.3, Ca 8.7, wbc 4.9, h/h 12.7/36.4, plts 90, vit D 37 Assessment/Plan 1. Aphasia following cerebrovascular disease -has worked with speech therapy -f/u flp--should be on statin s/p stroke even if lipids normal for secondary prevention though continues to smoke greatly increasing his risks - DNR (Do Not Resuscitate)  2. Essential hypertension, benign -at goal with lisinopril alone  3. Chronic pain syndrome -neuropathic pain s/p cva -cont on lyrica, norco, tylenol  4. GERD (gastroesophageal reflux disease) -cont ranitidine  5.  Unspecified vitamin D deficiency -cont supplement, levels nearly at goal of 40  6. Unspecified protein-calorie malnutrition -cont protein supplements -check liver panel for albumin  7.  Nasal congestion: -temporary use of flonase for allergies--6 wks, then d/c  Goals of care: DNR  Labs/tests ordered:  FLP, liver panel next draw due to prior stroke

## 2013-02-23 ENCOUNTER — Non-Acute Institutional Stay (SKILLED_NURSING_FACILITY): Payer: Medicare Other | Admitting: Internal Medicine

## 2013-02-23 ENCOUNTER — Encounter: Payer: Self-pay | Admitting: Internal Medicine

## 2013-02-23 DIAGNOSIS — I6350 Cerebral infarction due to unspecified occlusion or stenosis of unspecified cerebral artery: Secondary | ICD-10-CM

## 2013-02-23 DIAGNOSIS — I639 Cerebral infarction, unspecified: Secondary | ICD-10-CM

## 2013-02-23 NOTE — Assessment & Plan Note (Signed)
Fasting lipids are excellent- Chol  130; TG - 75;  LDL-85;  HDL-30  So are not risk for stroke LFT's- normal except alkphos which waxes and wanes 02/2012-208;  04/2012-136;  12/2012-122;  01/2013-171; no intervention now;will repeat LFT's 1 month.

## 2013-02-23 NOTE — Progress Notes (Signed)
MRN: [id number] Name: Bradley Boyle  Sex: male Age: 79 y.o. DOB: [birth year]  PSC #:  Facility/Room: Level Of Care: SNF Provider: Merrilee Seashore D Emergency Contacts: Extended Emergency Contact Information Primary Emergency Contact: [place]  [place] Home Phone: [phone] Relation: Daughter  Code Status: DNR  Allergies: Review of patient's allergies indicates no known allergies.  Chief Complaint  Patient presents with  . Medical Managment of Chronic Issues    HPI: Patient is 79 y.o. male whose labs were reviewed today. Pt has no c/o.  Past Medical History  Diagnosis Date  . Cerebral vascular disease   . Hypotension   . Neuropathy   . Chronic pain   . Lymphedema   . Depressive disorder   . Insomnia   . Aphasia   . Hypokalemia   . Diabetes mellitus   . Anemia   . Dehydration     History reviewed. No pertinent past surgical history.    Medication List       This list is accurate as of: 02/23/13  5:47 PM.  Always use your most recent med list.               acetaminophen 325 MG tablet  Commonly known as:  TYLENOL  650 mg every 6 (six) hours as needed. Fever pain     albuterol (2.5 MG/3ML) 0.083% nebulizer solution  Commonly known as:  PROVENTIL  Take 2.5 mg by nebulization every 6 (six) hours as needed. Shortness of breath     cholecalciferol 1000 UNITS tablet  Commonly known as:  VITAMIN D  Take 1,000 Units by mouth daily.     feeding supplement Liqd  Take 237 mLs by mouth 3 (three) times daily with meals.     HYDROcodone-acetaminophen 5-325 MG per tablet  Commonly known as:  NORCO/VICODIN  Take one tablet every 4 hours as needed for pain     lisinopril 10 MG tablet  Commonly known as:  PRINIVIL,ZESTRIL  Take 10 mg by mouth daily.     multivitamin with minerals Tabs  Take 1 tablet by mouth daily.     pregabalin 75 MG capsule  Commonly known as:  LYRICA  Take 1 capsule (75 mg total) by mouth 3 (three) times  daily.     ranitidine 150 MG tablet  Commonly known as:  ZANTAC  Take 150 mg by mouth at bedtime.     vitamin C 500 MG tablet  Commonly known as:  ASCORBIC ACID  Take 500 mg by mouth daily.     Vitamin D (Ergocalciferol) 50000 UNITS Caps  Commonly known as:  DRISDOL  Take 50,000 Units by mouth every 7 (seven) days.        No orders of the defined types were placed in this encounter.     There is no immunization history on file for this patient.  History  Substance Use Topics  . Smoking status: Former Smoker -- 1.00 packs/day    Types: Cigarettes  . Smokeless tobacco: Never Used  . Alcohol Use:     Family history is noncontributory    Review of Systems  DATA OBTAINED: from patient GENERAL: Feels well; no pain SKIN: No itching, rash or wounds HEENT- no c/o RESPIRATORY: No cough, wheezing, SOB CARDIAC: No chest pain, palpitations, lower extremity edema  GI: No abdominal pain, No N/V/D or constipation, No heartburn or reflux  NEUROLOGIC: Awake, alert, appropriate to situation, No change in mental status.R side weakness at baseline PSYCHIATRIC: No overt  anxiety or sadness. Sleeps well. No behavior issue.      Filed Vitals:   02/23/13 1611  BP: 102/60  Pulse: 103  Temp: 97.2 F (36.2 C)  Resp: 20    Physical Exam  GENERAL APPEARANCE: Alert, slurred speech at baseline Appropriately groomed. No acute distress.  SKIN: No diaphoresis rash, or wounds HEENT-unremarkable RESPIRATORY: Breathing is even, unlabored. Lung sounds are clear   CARDIOVASCULAR: Heart RRR no murmurs, rubs or gallops. No peripheral edema.   GASTROINTESTINAL: Abdomen is soft, non-tender, not distended w/ normal bowel sounds. MUSCULOSKELETAL: No abnormal joints or musculature NEUROLOGIC:awake alert slurred speech isbaseline, baseline R side paresis PSYCHIATRIC: Mood and affect appropriate to situation, no behavioral issues   Assessment and Plan  CVA (cerebral vascular accident) Fasting  lipids are excellent- Chol  130; TG - 75;  LDL-85;  HDL-30  So are not risk for stroke LFT's- normal except alkphos which waxes and wanes 02/2012-208;  04/2012-136;  12/2012-122;  01/2013-171; no intervention now;will repeat LFT's 1 month.   Margit Hanks, MD

## 2013-04-10 ENCOUNTER — Non-Acute Institutional Stay (SKILLED_NURSING_FACILITY): Payer: Medicare Other | Admitting: Nurse Practitioner

## 2013-04-10 DIAGNOSIS — I639 Cerebral infarction, unspecified: Secondary | ICD-10-CM

## 2013-04-10 DIAGNOSIS — E559 Vitamin D deficiency, unspecified: Secondary | ICD-10-CM

## 2013-04-10 DIAGNOSIS — I10 Essential (primary) hypertension: Secondary | ICD-10-CM

## 2013-04-10 DIAGNOSIS — I6350 Cerebral infarction due to unspecified occlusion or stenosis of unspecified cerebral artery: Secondary | ICD-10-CM

## 2013-04-10 DIAGNOSIS — D649 Anemia, unspecified: Secondary | ICD-10-CM

## 2013-04-25 NOTE — Progress Notes (Signed)
Patient ID: Bradley Boyle, male   DOB: 07/03/1930, 79 y.o.   MRN: [id number]   PCP: Bufford Spikes, DO   No Known Allergies  Chief Complaint  Patient presents with  . Medical Managment of Chronic Issues    HPI:  79 year old with PMH of CVA, GERD, vit d def, aphagia, chronic pain, HTN is seen today for routine follow up; pt without complaints and nursing has no concerns at this time.  CVA (cerebral vascular accident)  He is presently stable no worsening of symptoms Essential hypertension, benign  Blood pressure under good control taking lisinopril 10 mg dialy  GERD (gastroesophageal reflux disease)  zantac 150 mg nightly  Chronic pain syndrome  Appears pain is being adequately managed by taking lyrica 75 mg three times daily takes vicodin 5/325 mg every 4 hours as needed for apin  Unspecified vitamin D deficiency  Is taking vitamin d 1000 units daily  Review of Systems:  Unable to obtain  Past Medical History  Diagnosis Date  . Cerebral vascular disease   . Hypotension   . Neuropathy   . Chronic pain   . Lymphedema   . Depressive disorder   . Insomnia   . Aphasia   . Hypokalemia   . Diabetes mellitus   . Anemia   . Dehydration    No past surgical history on file. Social History:   reports that he has quit smoking. His smoking use included Cigarettes. He smoked 1.00 pack per day. He has never used smokeless tobacco. He reports that he does not use illicit drugs. His alcohol history is not on file.  No family history on file.  Medications: Patient's Medications  New Prescriptions   No medications on file  Previous Medications   ACETAMINOPHEN (TYLENOL) 325 MG TABLET    650 mg every 6 (six) hours as needed. Fever pain    ALBUTEROL (PROVENTIL) (2.5 MG/3ML) 0.083% NEBULIZER SOLUTION    Take 2.5 mg by nebulization every 6 (six) hours as needed. Shortness of breath    CHOLECALCIFEROL (VITAMIN D) 1000 UNITS TABLET    Take 1,000 Units by mouth daily.   FEEDING  SUPPLEMENT (ENSURE CLINICAL STRENGTH) LIQD    Take 237 mLs by mouth 3 (three) times daily with meals.   HYDROCODONE-ACETAMINOPHEN (NORCO/VICODIN) 5-325 MG PER TABLET    Take one tablet every 4 hours as needed for pain   LISINOPRIL (PRINIVIL,ZESTRIL) 10 MG TABLET    Take 10 mg by mouth daily.   MULTIPLE VITAMIN (MULITIVITAMIN WITH MINERALS) TABS    Take 1 tablet by mouth daily.     PREGABALIN (LYRICA) 75 MG CAPSULE    Take 1 capsule (75 mg total) by mouth 3 (three) times daily.   RANITIDINE (ZANTAC) 150 MG TABLET    Take 150 mg by mouth at bedtime.     VITAMIN C (ASCORBIC ACID) 500 MG TABLET    Take 500 mg by mouth daily.     VITAMIN D, ERGOCALCIFEROL, (DRISDOL) 50000 UNITS CAPS    Take 50,000 Units by mouth every 7 (seven) days.    Modified Medications   No medications on file  Discontinued Medications   No medications on file     Physical Exam:  Filed Vitals:   04/10/13 2013  BP: 95/54  Pulse: 98  Temp: 96.9 F (36.1 C)  Resp: 20   GENERAL APPEARANCE: Alert, slurred speech at baseline Appropriately groomed. No acute distress.  SKIN: No diaphoresis rash, or wounds  HEENT-unremarkable  RESPIRATORY:  Breathing is even, unlabored. Lung sounds are clear  CARDIOVASCULAR: Heart RRR no murmurs, rubs or gallops. No peripheral edema.  GASTROINTESTINAL: Abdomen is soft, non-tender, not distended w/ normal bowel sounds.  MUSCULOSKELETAL: No abnormal joints or musculature  NEUROLOGIC:awake alert slurred speech isbaseline, baseline R side paresis  PSYCHIATRIC: Mood and affect appropriate to situation, no behavioral issues   Labs reviewed: 01/05/2013: sodium 137, potassium 4.9, glucose 61, bun 45, cr 096 Wbc 4.9, hgb 12.7, hct 36 Vit D 37 02/23/2013 chol 130, trig 75, HDL 30, LDL 85   Assessment/Plan 1. Essential hypertension, benign Well controlled BP on current medications; will follow up bmp  2. CVA (cerebral vascular accident) Stable; lipids under good control   3. Unspecified  vitamin D deficiency latest vit D level good; will cont current medications   4. Anemia Will follow up cbc

## 2013-06-08 ENCOUNTER — Encounter: Payer: Self-pay | Admitting: Adult Health

## 2013-06-08 ENCOUNTER — Non-Acute Institutional Stay (SKILLED_NURSING_FACILITY): Payer: Medicare Other | Admitting: Nurse Practitioner

## 2013-06-08 DIAGNOSIS — E559 Vitamin D deficiency, unspecified: Secondary | ICD-10-CM

## 2013-06-08 DIAGNOSIS — D649 Anemia, unspecified: Secondary | ICD-10-CM

## 2013-06-08 DIAGNOSIS — I69920 Aphasia following unspecified cerebrovascular disease: Secondary | ICD-10-CM

## 2013-06-08 DIAGNOSIS — I639 Cerebral infarction, unspecified: Secondary | ICD-10-CM

## 2013-06-08 DIAGNOSIS — R1310 Dysphagia, unspecified: Secondary | ICD-10-CM

## 2013-06-08 DIAGNOSIS — I69320 Aphasia following cerebral infarction: Secondary | ICD-10-CM

## 2013-06-08 DIAGNOSIS — I6350 Cerebral infarction due to unspecified occlusion or stenosis of unspecified cerebral artery: Secondary | ICD-10-CM

## 2013-06-08 DIAGNOSIS — K219 Gastro-esophageal reflux disease without esophagitis: Secondary | ICD-10-CM

## 2013-06-08 DIAGNOSIS — I10 Essential (primary) hypertension: Secondary | ICD-10-CM

## 2013-06-08 NOTE — Progress Notes (Signed)
Patient ID: Bradley Boyle, male   DOB: 30-Jul-1930, 79 y.o.   MRN: [id number] Nursing Home Location:  [facility]   Place of Service: SNF (31)  PCP: REED, TIFFANY, DO   No Known Allergies  Chief Complaint  Patient presents with  . Medical Managment of Chronic Issues    HPI:  79 year old with PMH of CVA, GERD, vit d def, aphagia, chronic pain, HTN is seen today for routine follow up; staff reports pt with decrease PO intake; pt reports difficulty swallowing, no pain with swallowing or pain in mouth  CVA (cerebral vascular accident) He is presently stable no worsening of symptoms  Essential hypertension, benign Blood pressure under good control taking lisinopril 10 mg dialy  GERD (gastroesophageal reflux disease) zantac 150 mg nightly  Chronic pain syndrome Appears pain is being adequately managed by taking lyrica 75 mg three times daily takes vicodin 5/325 mg every 4 hours as needed for apin  Unspecified vitamin D deficiency Is taking vitamin d 46962 units monthly  Review of Systems:  Review of Systems  Unable to perform ROS: dementia     Past Medical History  Diagnosis Date  . Cerebral vascular disease   . Hypotension   . Neuropathy   . Chronic pain   . Lymphedema   . Depressive disorder   . Insomnia   . Aphasia   . Hypokalemia   . Diabetes mellitus   . Anemia   . Dehydration    No past surgical history on file. Social History:   reports that he has quit smoking. His smoking use included Cigarettes. He smoked 1.00 pack per day. He has never used smokeless tobacco. He reports that he does not use illicit drugs. His alcohol history is not on file.  No family history on file.  Medications: Patient's Medications  New Prescriptions   No medications on file  Previous Medications   ACETAMINOPHEN (TYLENOL) 325 MG TABLET    650 mg every 6 (six) hours as needed. Fever pain    ALBUTEROL (PROVENTIL) (2.5 MG/3ML) 0.083% NEBULIZER SOLUTION    Take 2.5 mg  by nebulization every 6 (six) hours as needed. Shortness of breath           FEEDING SUPPLEMENT (ENSURE CLINICAL STRENGTH) LIQD    Take 237 mLs by mouth 3 (three) times daily with meals.   HYDROCODONE-ACETAMINOPHEN (NORCO/VICODIN) 5-325 MG PER TABLET    Take one tablet every 4 hours as needed for pain   LISINOPRIL (PRINIVIL,ZESTRIL) 10 MG TABLET    Take 10 mg by mouth daily.   MULTIPLE VITAMIN (MULITIVITAMIN WITH MINERALS) TABS    Take 1 tablet by mouth daily.     PREGABALIN (LYRICA) 75 MG CAPSULE    Take 1 capsule (75 mg total) by mouth 3 (three) times daily.   RANITIDINE (ZANTAC) 150 MG TABLET    Take 150 mg by mouth at bedtime.     VITAMIN C (ASCORBIC ACID) 500 MG TABLET    Take 500 mg by mouth daily.     VITAMIN D, ERGOCALCIFEROL, (DRISDOL) 50000 UNITS CAPS    Take 50,000 Units by mouth every 30 (thirty) days.   Modified Medications   No medications on file  Discontinued Medications   No medications on file     Physical Exam: Physical Exam  Constitutional: He is well-developed, well-nourished, and in no distress. No distress.  HENT:  Mouth/Throat: Oropharynx is clear and moist. No oropharyngeal exudate.  Cardiovascular: Normal rate, regular  rhythm and normal heart sounds.   Pulmonary/Chest: Effort normal and breath sounds normal. No respiratory distress.  Abdominal: Soft. Bowel sounds are normal. He exhibits no distension. There is no tenderness.  Musculoskeletal: He exhibits no edema and no tenderness.  Neurological: He is alert.  Right sided hemiparesis   Skin: Skin is warm and dry. He is not diaphoretic.    Filed Vitals:   06/08/13 1218  BP: 103/56  Pulse: 58  Temp: 97.6 F (36.4 C)  Resp: 20  Weight: 163 lb (73.936 kg)      Labs reviewed: 01/05/2013: sodium 137, potassium 4.9, glucose 61, bun 45, cr 096  Wbc 4.9, hgb 12.7, hct 36 , plt 90 Vit D 37  02/23/2013 chol 130, trig 75, HDL 30, LDL 85  Lipid Profile    Result: 02/23/2013 10:21 AM   ( Status: F )      C Cholesterol 130     0-200 mg/dL SLN C Triglyceride 75     <150 mg/dL SLN   HDL Cholesterol 30   L >39 mg/dL SLN   Total Chol/HDL Ratio 4.3      Ratio SLN   VLDL Cholesterol (Calc) 15     0-40 mg/dL SLN   LDL Cholesterol (Calc) 85     0-99 mg/dL SLN C Liver Profile    Result: 02/23/2013 10:21 AM   ( Status: F )       Bilirubin, Total 0.8     0.3-1.2 mg/dL SLN   Bilirubin, Direct 0.2     0.0-0.3 mg/dL SLN   Indirect Bilirubin 0.6     0.0-0.9 mg/dL SLN   Alkaline Phosphatase 171   H 39-117 U/L SLN   AST/SGOT 25     0-37 U/L SLN   ALT/SGPT 22     0-53 U/L SLN   Total Protein 6.2     6.0-8.3 g/dL SLN   Albumin 3.1  4.09.81 cbc- wbc 5.7, rbc 4.2, hgb 12.2, hct 35.9, plt 123 Sodium 137, potassium 4.7, gluocse 57, BUN 29, Cr 0.98 Assessment/Plan 1. GERD (gastroesophageal reflux disease) Denies symptoms on ranitidine   2. CVA (cerebral vascular accident) Stable; lipids good; will cont to monitor  3. Essential hypertension, benign Patient is stable; continue current regimen. Will monitor and make changes as necessary.  4. Anemia Patients hgb is stable  5. Aphasia as late effect of cerebrovascular accident conts to be aphasic but is able to communicate by answering yes and no; trys to talk but unsucessful  6. Unspecified vitamin D deficiency Stable; will cont current medications  7. Dysphagia Reports he has a hard time swallowing therefore with decrease intake, weight is stable; will have ST evaluate

## 2013-06-11 ENCOUNTER — Encounter: Payer: Self-pay | Admitting: *Deleted

## 2013-06-18 ENCOUNTER — Other Ambulatory Visit (HOSPITAL_COMMUNITY): Payer: Self-pay | Admitting: Internal Medicine

## 2013-06-18 DIAGNOSIS — R1310 Dysphagia, unspecified: Secondary | ICD-10-CM

## 2013-06-19 ENCOUNTER — Ambulatory Visit (HOSPITAL_COMMUNITY): Payer: Medicare Other

## 2013-06-22 ENCOUNTER — Ambulatory Visit (HOSPITAL_COMMUNITY)
Admission: RE | Admit: 2013-06-22 | Discharge: 2013-06-22 | Disposition: A | Discharge status: Home / Self Care | Payer: Medicare Other | Source: Ambulatory Visit | Attending: Internal Medicine | Admitting: Internal Medicine

## 2013-06-22 DIAGNOSIS — R1310 Dysphagia, unspecified: Secondary | ICD-10-CM

## 2013-06-22 NOTE — Procedures (Signed)
Objective Swallowing Evaluation: Modified Barium Swallowing Study  Patient Details  Name: [facility] MRN: [id number] Date of Birth: 28-Jun-1930  Today's Date: 06/22/2013 Time: 1330-1411 SLP Time Calculation (min): 41 min  Past Medical History:  Past Medical History  Diagnosis Date  . Cerebral vascular disease   . Hypotension   . Neuropathy   . Chronic pain   . Lymphedema   . Depressive disorder   . Insomnia   . Aphasia   . Hypokalemia   . Diabetes mellitus   . Anemia   . Dehydration   . CVA (cerebral vascular accident) 10/20/2012  . Essential hypertension, benign 10/20/2012  . GERD (gastroesophageal reflux disease) 10/20/2012  . Urinary tract bacterial infections 07/17/2011  . Aphasia as late effect of cerebrovascular accident 07/17/2011  . Physical deconditioning 07/17/2011   Past Surgical History: No past surgical history on file. HPI:  Pt is an 79 yo male referred for MBS due to pt complaint of pain with swallowing and concerns pt may be aspirating. PMH + for COPD, CVA, HTN, neuropathy, dehydration, anemia, DM, insomnia, chronic pain.  Pt reported to NP that he quit smoking (Jun 08, 2013 note).  Pt has been seen by SLP in past and he was on a regular/thin diet during admit 06/2011.  SNF SLP reports pt with poor intake ( prefers peanut butter & jelly) weight loss but denies pnas/pulmonary infections.      Assessment / Plan / Recommendation Clinical Impression  Clinical impression: Pt presents with mild oral, moderate pharyngeal and cervical esophageal dysphagia involving sensorimotor tracts.  In addition, pt appeared with ? cervical osteophyte vs spinal fusion at C3-C4 and he is kyphotic impacting swallow ability.  + mild amount of aspiration with thin and laryngeal penetration of nectar observed  due to decreased laryngeal elevation, and poor proximal esophageal opening.  Delayed pharyngeal swallow noted with liquids - pooling at pyriform sinus region.  Chin tuck posture  helpful to decrease stasis and prevent overt aspiration.   Cued dry swallows (though difficult for pt to perform due to CVA hx) are helpful to decrease stasis that pt does not sense.  SLP questions if globus sensation could be secretion accumulation and/or CP dysfunction.    As pt able to clear trace amount of upper tracheal asp of thin with cough, recommend to consider soft/thin with precautions monitoring closely for tolerance and modifying as needed.  Pt with increased stasis of nectar - which may increase risk more than thin.  Skilled intervention included providing pt with verbal and visual feedback to effective compensation strategies.  Pt may benefit from ENT referral to determine if procedure may be indicated to mitigate cervical esophageal dysphagia.      Treatment Recommendation  Defer treatment plan to SLP at (Comment)    Diet Recommendation Dysphagia 3 (Mechanical Soft);Thin liquid   Liquid Administration via: Cup;Straw Medication Administration: Crushed with puree Supervision: Patient able to self feed;Full supervision/cueing for compensatory strategies (initially to generalize strategies) Compensations: Slow rate;Small sips/bites;Follow solids with liquid;Multiple dry swallows after each bite/sip Postural Changes and/or Swallow Maneuvers: Seated upright 90 degrees;Upright 30-60 min after meal    Other  Recommendations Oral Care Recommendations: Oral care BID   Follow Up Recommendations  Skilled Nursing facility           SLP Swallow Goals     General HPI: Pt is an 79 yo male referred for MBS due to pt complaint of pain with swallowing and concerns pt may be aspirating. PMH +  for COPD, CVA, HTN, neuropathy, dehydration, anemia, DM, insomnia, chronic pain.  Pt reported to NP that he quit smoking (Jun 08, 2013 note).  Pt has been seen by SLP in past and he was on a regular/thin diet during admit 06/2011.  SNF SLP reports pt with poor intake ( prefers peanut butter & jelly) weight  loss but denies pnas/pulmonary infections.  Type of Study: Modified Barium Swallowing Study Reason for Referral: Objectively evaluate swallowing function Diet Prior to this Study: Regular Respiratory Status: Room air History of Recent Intubation: No Behavior/Cognition: Alert;Cooperative Oral Cavity - Dentition: Edentulous (appeared to have had implants front -at one time per xray findings) Oral Motor / Sensory Function: Impaired sensory;Impaired motor Oral impairment: Right facial;Right labial Self-Feeding Abilities: Able to feed self;Needs set up Patient Positioning: Upright in chair Baseline Vocal Quality: Clear Volitional Cough: Weak Volitional Swallow: Able to elicit (with extra time due to motor programming issues) Anatomy: Other (Comment) (appearance of large oesteophyte/fusion of C3-C4) Pharyngeal Secretions: Standing secretions in (comment) (in pharynx, mixed with barium)    Reason for Referral Objectively evaluate swallowing function   Oral Phase Oral Preparation/Oral Phase Oral Phase: Impaired Oral - Nectar Oral - Nectar Teaspoon: Delayed oral transit;Reduced posterior propulsion;Right anterior bolus loss;Weak lingual manipulation Oral - Nectar Cup: Delayed oral transit;Reduced posterior propulsion;Right anterior bolus loss;Weak lingual manipulation Oral - Nectar Straw: Reduced posterior propulsion;Delayed oral transit;Weak lingual manipulation Oral - Thin Oral - Thin Teaspoon: Right anterior bolus loss;Reduced posterior propulsion;Weak lingual manipulation Oral - Thin Cup: Right anterior bolus loss;Reduced posterior propulsion;Weak lingual manipulation Oral - Thin Straw: Delayed oral transit;Reduced posterior propulsion;Weak lingual manipulation Oral - Solids Oral - Puree: Delayed oral transit;Reduced posterior propulsion;Weak lingual manipulation Oral - Regular: Weak lingual manipulation;Reduced posterior propulsion;Delayed oral transit   Pharyngeal Phase Pharyngeal  Phase Pharyngeal Phase: Impaired Pharyngeal - Nectar Pharyngeal - Nectar Teaspoon: Premature spillage to pyriform sinuses;Reduced airway/laryngeal closure;Reduced tongue base retraction;Reduced pharyngeal peristalsis;Pharyngeal residue - valleculae;Pharyngeal residue - pyriform sinuses Pharyngeal - Nectar Cup: Premature spillage to pyriform sinuses;Reduced airway/laryngeal closure;Reduced pharyngeal peristalsis;Reduced tongue base retraction;Pharyngeal residue - valleculae;Pharyngeal residue - pyriform sinuses Pharyngeal - Nectar Straw: Premature spillage to pyriform sinuses;Reduced airway/laryngeal closure;Pharyngeal residue - valleculae;Pharyngeal residue - pyriform sinuses;Reduced tongue base retraction;Reduced pharyngeal peristalsis Pharyngeal - Thin Pharyngeal - Thin Teaspoon: Premature spillage to pyriform sinuses;Reduced airway/laryngeal closure Pharyngeal - Thin Cup: Premature spillage to pyriform sinuses;Pharyngeal residue - pyriform sinuses;Pharyngeal residue - valleculae;Reduced airway/laryngeal closure;Penetration/Aspiration during swallow;Reduced tongue base retraction;Reduced pharyngeal peristalsis Penetration/Aspiration details (thin cup): Material enters airway, passes BELOW cords and not ejected out despite cough attempt by patient Pharyngeal - Thin Straw: Premature spillage to pyriform sinuses;Pharyngeal residue - pyriform sinuses;Pharyngeal residue - valleculae;Penetration/Aspiration during swallow;Reduced pharyngeal peristalsis;Reduced tongue base retraction;Reduced airway/laryngeal closure Pharyngeal - Solids Pharyngeal - Puree: Premature spillage to pyriform sinuses;Pharyngeal residue - pyriform sinuses;Pharyngeal residue - valleculae;Reduced pharyngeal peristalsis;Reduced tongue base retraction;Reduced airway/laryngeal closure Pharyngeal - Regular: Premature spillage to pyriform sinuses;Pharyngeal residue - pyriform sinuses;Pharyngeal residue - valleculae;Reduced pharyngeal  peristalsis;Reduced tongue base retraction;Reduced airway/laryngeal closure Pharyngeal Phase - Comment Pharyngeal Comment: trace aspiration of thin with chin tuck was cleared with coughing- reflexive throat clearing observed with penetration, head turn right not helpful to decrease/prevent stasis nor elminiate aspiration/penetration  Cervical Esophageal Phase    GO    Cervical Esophageal Phase Cervical Esophageal Phase: Impaired Cervical Esophageal Phase - Nectar Nectar Teaspoon: Reduced cricopharyngeal relaxation Nectar Cup: Reduced cricopharyngeal relaxation;Prominent cricopharyngeal segment Nectar Straw: Reduced cricopharyngeal relaxation;Prominent cricopharyngeal segment Cervical Esophageal Phase - Thin Thin Teaspoon: Reduced cricopharyngeal  relaxation Thin Cup: Reduced cricopharyngeal relaxation;Prominent cricopharyngeal segment Thin Straw: Reduced cricopharyngeal relaxation;Prominent cricopharyngeal segment Cervical Esophageal Phase - Solids Puree: Reduced cricopharyngeal relaxation Regular: Reduced cricopharyngeal relaxation Cervical Esophageal Phase - Comment Cervical Esophageal Comment: pt appeared with very poor opening of proximal esophagus, prominent CP observed x2 with liquids, ? if pt would benefit from ENT referral to determine if any procedure may be conducted to mitigate dysphagia- remainder of esophagus appeared to clear adequately    Functional Assessment Tool Used: mbs, clinical judgement Functional Limitations: Swallowing Swallow Current Status (Z6109): At least 40 percent but less than 60 percent impaired, limited or restricted Swallow Goal Status [phone]): At least 40 percent but less than 60 percent impaired, limited or restricted Swallow Discharge Status [phone]): At least 40 percent but less than 60 percent impaired, limited or restricted    Donavan Burnet, MS [facility] SLP [phone]

## 2013-06-23 ENCOUNTER — Ambulatory Visit (HOSPITAL_COMMUNITY): Payer: Medicare Other

## 2013-06-24 ENCOUNTER — Encounter (HOSPITAL_COMMUNITY): Payer: Medicare Other

## 2013-06-28 IMAGING — CR DG ABDOMEN 1V
1 series · 1 of 1 positions shown · non-contrast
Comparison: 11/27/2004

CLINICAL DATA: Feeding tube placement.

ABDOMEN - 1 VIEW

[ap (kub)]
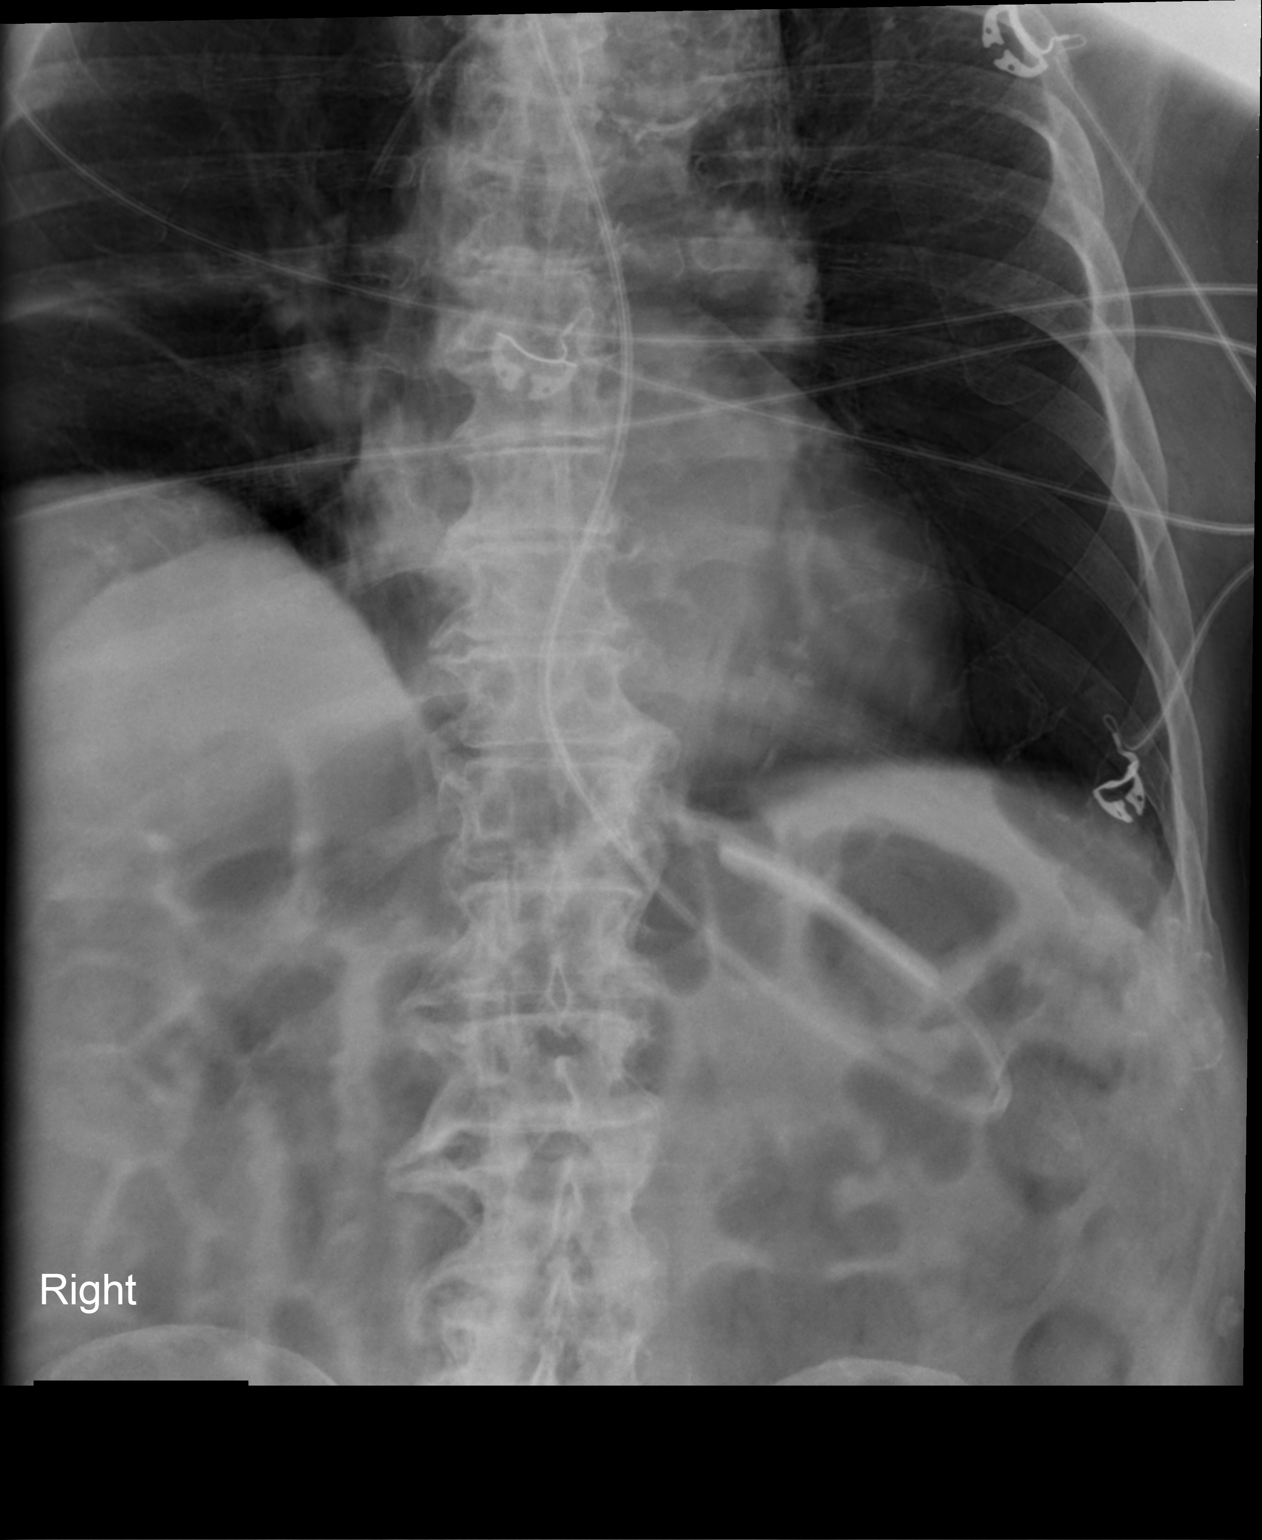

[1 of 1 positions shown; findings below may reference images not displayed]

FINDINGS: Feeding tube coils in the fundus of the stomach.  There
is a nonobstructive bowel gas pattern.
IMPRESSION: Feeding tube coils in the fundus of the stomach.

## 2013-07-20 ENCOUNTER — Other Ambulatory Visit: Payer: Self-pay | Admitting: *Deleted

## 2013-07-20 MED ORDER — HYDROCODONE-ACETAMINOPHEN 5-325 MG PO TABS: ORAL_TABLET | ORAL | Status: DC

## 2013-07-20 NOTE — Telephone Encounter (Signed)
rx filled per protocol  

## 2013-08-04 ENCOUNTER — Other Ambulatory Visit: Payer: Self-pay | Admitting: *Deleted

## 2013-08-04 MED ORDER — HYDROCODONE-ACETAMINOPHEN 5-325 MG PO TABS: ORAL_TABLET | ORAL | Status: DC

## 2013-08-16 ENCOUNTER — Encounter: Payer: Self-pay | Admitting: Internal Medicine

## 2013-08-26 ENCOUNTER — Non-Acute Institutional Stay (SKILLED_NURSING_FACILITY): Payer: Medicare Other | Admitting: Internal Medicine

## 2013-08-26 DIAGNOSIS — R1310 Dysphagia, unspecified: Secondary | ICD-10-CM

## 2013-08-26 DIAGNOSIS — I10 Essential (primary) hypertension: Secondary | ICD-10-CM

## 2013-08-26 DIAGNOSIS — I6350 Cerebral infarction due to unspecified occlusion or stenosis of unspecified cerebral artery: Secondary | ICD-10-CM

## 2013-08-26 DIAGNOSIS — K219 Gastro-esophageal reflux disease without esophagitis: Secondary | ICD-10-CM

## 2013-08-26 DIAGNOSIS — I69920 Aphasia following unspecified cerebrovascular disease: Secondary | ICD-10-CM

## 2013-08-26 DIAGNOSIS — I639 Cerebral infarction, unspecified: Secondary | ICD-10-CM

## 2013-08-26 DIAGNOSIS — I69320 Aphasia following cerebral infarction: Secondary | ICD-10-CM

## 2013-08-26 DIAGNOSIS — G894 Chronic pain syndrome: Secondary | ICD-10-CM

## 2013-08-26 NOTE — Progress Notes (Signed)
Patient ID: Bradley Boyle, male   DOB: 03/18/1930, 80 y.o.   MRN: [id number]  [facility] Provider:  Gwenith Spitziffany L. Renato Boyle, D.O., C.M.D.  Code Status:  DNR  Chief Complaint  Patient presents with  . Medical Management of Chronic Issues    HPI:  80 yo white male with h/o stroke, COPD, ongoing tobacco abuse, neuropathy, vitamin D deficiency, hypertension and chronic pain was seen for medical mgt of chronic diseases.  He has been declining.  His po intake has declined and he is losing weight.  Review of Systems:  Review of Systems  Constitutional: Positive for weight loss and malaise/fatigue. Negative for fever and chills.  Respiratory: Positive for cough. Negative for shortness of breath.   Cardiovascular: Negative for chest pain.  Gastrointestinal: Negative for abdominal pain and constipation.  Genitourinary: Negative for dysuria.  Musculoskeletal: Negative for falls and myalgias.  Skin: Negative for rash.  Neurological: Positive for tingling, sensory change, speech change, focal weakness and weakness. Negative for headaches.  Endo/Heme/Allergies: Bruises/bleeds easily.  Psychiatric/Behavioral: Positive for memory loss.    Medications: Patient's Medications  New Prescriptions   No medications on file  Previous Medications   ACETAMINOPHEN (TYLENOL) 325 MG TABLET    650 mg every 6 (six) hours as needed. Fever pain    ALBUTEROL (PROVENTIL) (2.5 MG/3ML) 0.083% NEBULIZER SOLUTION    Take 2.5 mg by nebulization every 6 (six) hours as needed. Shortness of breath    FEEDING SUPPLEMENT (ENSURE CLINICAL STRENGTH) LIQD    Take 237 mLs by mouth 3 (three) times daily with meals.   FLUTICASONE (FLONASE) 50 MCG/ACT NASAL SPRAY    Place into both nostrils daily.   LISINOPRIL (PRINIVIL,ZESTRIL) 10 MG TABLET    Take 10 mg by mouth daily.   MIRTAZAPINE (REMERON PO)    Take 75 mg by mouth at bedtime.   MULTIPLE VITAMIN (MULITIVITAMIN WITH MINERALS) TABS    Take 1 tablet by  mouth daily.     RANITIDINE (ZANTAC) 150 MG TABLET    Take 150 mg by mouth at bedtime.     SERTRALINE (ZOLOFT) 50 MG TABLET    Take 50 mg by mouth daily.   VITAMIN C (ASCORBIC ACID) 500 MG TABLET    Take 500 mg by mouth daily.     VITAMIN D, ERGOCALCIFEROL, (DRISDOL) 50000 UNITS CAPS    Take 50,000 Units by mouth every 30 (thirty) days.   Modified Medications   Modified Medication Previous Medication   HYDROCODONE-ACETAMINOPHEN (NORCO/VICODIN) 5-325 MG PER TABLET HYDROcodone-acetaminophen (NORCO/VICODIN) 5-325 MG per tablet      Take one tablet by mouth every 4 hours as needed for pain    Take one tablet by mouth every 4 hours as needed for pain   OXYCODONE (OXYCONTIN) 10 MG T12A 12 HR TABLET OxyCODONE (OXYCONTIN) 10 mg T12A 12 hr tablet      Take 1 tablet by mouth every 8 hours.  DO NOT CRUSH    Take 1 tablet by mouth every 8 hours.  DO NOT CRUSH   PREGABALIN (LYRICA) 75 MG CAPSULE pregabalin (LYRICA) 75 MG capsule      Take 1 capsule (75 mg total) by mouth 3 (three) times daily.    Take 1 capsule (75 mg total) by mouth 3 (three) times daily.  Discontinued Medications   HYDROCODONE-ACETAMINOPHEN (NORCO/VICODIN) 5-325 MG PER TABLET    Take one tablet by mouth every 4 hours as needed for pain, not to exceed 3000mg  of APAP from  all sources/24H    Physical Exam: Filed Vitals:   08/26/13 0824  BP: 139/52  Pulse: 100  Temp: 97.7 F (36.5 C)  Resp: 18  Height: 5\' 8"  (1.727 m)  Weight: 151 lb (68.493 kg)  SpO2: 97%   Physical Exam  Constitutional:  Increasingly frail white male  Cardiovascular: Normal rate, regular rhythm, normal heart sounds and intact distal pulses.   Pulmonary/Chest: Effort normal. No respiratory distress. He has wheezes.  Abdominal: Soft. Bowel sounds are normal. He exhibits no distension. There is no tenderness.  Neurological:  Hemiparetic;  Facial droop, severe dysarthria, some aphasia  Skin: Skin is warm and dry.  Psychiatric: He has a normal mood and affect.    Labs reviewed: Last labs 9/14  Assessment/Plan 1. CVA (cerebral vascular accident) -with hemiparesis, aphasia, dysarthria, dysphagia -suspect he may have had some additional small strokes  2. Aphasia as late effect of cerebrovascular accident -very difficult for him to communicate anymore due to latest stroke -continues to smoke -due to decline, not on statin therapy any longer, bp controlled with ace alone  3. Dysphagia, unspecified(787.20) -on modified diet  -aspiration precautions  4. Essential hypertension, benign -bp at goal with ace only  5. Gastroesophageal reflux disease without esophagitis -cont zantac for this--flares up depending on what he eats  6. Chronic pain syndrome -cont norco prn   Family/ staff Communication: discussed with nursing staff Goals of care: DNR, is long term care resident  Labs/tests ordered:  Cbc, cmp, flp, vit D

## 2013-09-29 DIAGNOSIS — Z720 Tobacco use: Secondary | ICD-10-CM | POA: Insufficient documentation

## 2013-10-01 ENCOUNTER — Other Ambulatory Visit: Payer: Self-pay | Admitting: *Deleted

## 2013-10-01 MED ORDER — HYDROCODONE-ACETAMINOPHEN 5-325 MG PO TABS: ORAL_TABLET | ORAL | Status: DC

## 2013-10-01 NOTE — Telephone Encounter (Signed)
[facility] [place] 

## 2013-10-14 ENCOUNTER — Encounter: Payer: Self-pay | Admitting: Internal Medicine

## 2013-10-14 ENCOUNTER — Non-Acute Institutional Stay (SKILLED_NURSING_FACILITY): Payer: Medicare Other | Admitting: Internal Medicine

## 2013-10-14 DIAGNOSIS — I10 Essential (primary) hypertension: Secondary | ICD-10-CM

## 2013-10-14 DIAGNOSIS — F17200 Nicotine dependence, unspecified, uncomplicated: Secondary | ICD-10-CM

## 2013-10-14 DIAGNOSIS — I639 Cerebral infarction, unspecified: Secondary | ICD-10-CM

## 2013-10-14 DIAGNOSIS — I69320 Aphasia following cerebral infarction: Secondary | ICD-10-CM

## 2013-10-14 DIAGNOSIS — I69920 Aphasia following unspecified cerebrovascular disease: Secondary | ICD-10-CM

## 2013-10-14 DIAGNOSIS — G894 Chronic pain syndrome: Secondary | ICD-10-CM

## 2013-10-14 DIAGNOSIS — I6350 Cerebral infarction due to unspecified occlusion or stenosis of unspecified cerebral artery: Secondary | ICD-10-CM

## 2013-10-14 DIAGNOSIS — Z720 Tobacco use: Secondary | ICD-10-CM

## 2013-10-14 DIAGNOSIS — K219 Gastro-esophageal reflux disease without esophagitis: Secondary | ICD-10-CM

## 2013-10-14 NOTE — Progress Notes (Signed)
Patient ID: Bradley Boyle, male   DOB: 11/29/1929, 80 y.o.   MRN: [id number]  Provider:  Gwenith Spitziffany L. Renato Gailseed, D.O., C.M.D.  Location:  [facility]  PCP: Bufford SpikesEED, Tiyana Galla, DO  Code Status: DNR  No Known Allergies  Chief Complaint  Patient presents with  . Medical Managment of Chronic Issues    HPI: 80 y.o. male Long term care resident with h/o stroke, longstanding tobacco abuse, diabetes seen for med mgt chronic diseases.  Nursing staff have noticed that he is having more pain recently on his right side (affected stroke side).  He has a chronic facial droop and dysarthria, aphasia as well.  He does not always ask for the medication and then it wears off and he's in a lot of pain.  ROS: Review of Systems  Constitutional: Negative for fever and malaise/fatigue.  Eyes: Negative for blurred vision.  Respiratory: Negative for shortness of breath.   Cardiovascular: Negative for chest pain and palpitations.  Gastrointestinal: Negative for abdominal pain and constipation.  Genitourinary: Negative for dysuria.  Musculoskeletal:       Right side pain  Skin: Negative for rash.  Neurological: Positive for tingling, sensory change, speech change and focal weakness. Negative for headaches.  Psychiatric/Behavioral: Positive for memory loss.    Past Medical History  Diagnosis Date  . Cerebral vascular disease   . Hypotension   . Neuropathy   . Chronic pain   . Lymphedema   . Depressive disorder   . Insomnia   . Aphasia   . Hypokalemia   . Diabetes mellitus   . Anemia   . Dehydration   . CVA (cerebral vascular accident) 10/20/2012  . Essential hypertension, benign 10/20/2012  . GERD (gastroesophageal reflux disease) 10/20/2012  . Urinary tract bacterial infections 07/17/2011  . Aphasia as late effect of cerebrovascular accident 07/17/2011  . Physical deconditioning 07/17/2011   No past surgical history on file. Social History:   reports that he has been smoking Cigarettes.   He has been smoking about 1.00 pack per day. He has never used smokeless tobacco. He reports that he does not use illicit drugs. His alcohol history is not on file.  No family history on file.  Medications: Patient's Medications  New Prescriptions   No medications on file  Previous Medications   ACETAMINOPHEN (TYLENOL) 325 MG TABLET    650 mg every 6 (six) hours as needed. Fever pain    ALBUTEROL (PROVENTIL) (2.5 MG/3ML) 0.083% NEBULIZER SOLUTION    Take 2.5 mg by nebulization every 6 (six) hours as needed. Shortness of breath    FEEDING SUPPLEMENT (ENSURE CLINICAL STRENGTH) LIQD    Take 237 mLs by mouth 3 (three) times daily with meals.   FLUTICASONE (FLONASE) 50 MCG/ACT NASAL SPRAY    Place into both nostrils daily.   HYDROCODONE-ACETAMINOPHEN (NORCO/VICODIN) 5-325 MG PER TABLET    Take one tablet by mouth every 4 hours as needed for pain   LISINOPRIL (PRINIVIL,ZESTRIL) 10 MG TABLET    Take 10 mg by mouth daily.   MIRTAZAPINE (REMERON PO)    Take 75 mg by mouth at bedtime.   MULTIPLE VITAMIN (MULITIVITAMIN WITH MINERALS) TABS    Take 1 tablet by mouth daily.     PREGABALIN (LYRICA) 75 MG CAPSULE    Take 1 capsule (75 mg total) by mouth 3 (three) times daily.   RANITIDINE (ZANTAC) 150 MG TABLET    Take 150 mg by mouth at bedtime.     SERTRALINE (ZOLOFT) 50  MG TABLET    Take 50 mg by mouth daily.   VITAMIN C (ASCORBIC ACID) 500 MG TABLET    Take 500 mg by mouth daily.     VITAMIN D, ERGOCALCIFEROL, (DRISDOL) 50000 UNITS CAPS    Take 50,000 Units by mouth every 30 (thirty) days.   Modified Medications   No medications on file  Discontinued Medications   No medications on file     Physical Exam: Filed Vitals:   10/14/13 1045  BP: 144/65  Pulse: 69  Temp: 97.7 F (36.5 C)  Resp: 20  Weight: 151 lb (68.493 kg)  SpO2: 98%   Physical Exam  Constitutional: No distress.  Cardiovascular: Normal rate, regular rhythm, normal heart sounds and intact distal pulses.   Pulmonary/Chest:  Effort normal and breath sounds normal. No respiratory distress.  Abdominal: Soft. Bowel sounds are normal.  Musculoskeletal: He exhibits no edema and no tenderness.  Neurological:  Facial droop, dysarthric speech, some aphasia, right hemiparesis  Skin: Skin is warm and dry.  Psychiatric: He has a normal mood and affect.     Labs reviewed: 08/27/2013: Chol 133, Trig 93, HDL 28, LDL 86, VLDL 19 CMP: G 64, BUN 42, Cr 0.96, BUN/Cr 43.8, Na 139, K 4.9, ALB 2.8, ALKP 133 CBC: WBC 6.7, RBC 4.5, H/H 12.9/40.3, PLT 130 Imaging and Procedures:  Assessment/Plan 1. Chronic pain syndrome -will add oxycontin 10mg  po q12hr routinely for pain  2. CVA (cerebral vascular accident) -with right hemiparesis, chronic pain, aphasia, dysphagia  3. Essential hypertension, benign -bp fair considering his recent decline and goals of care  4. Gastroesophageal reflux disease without esophagitis -cont  Zantac  5. Aphasia as late effect of cerebrovascular accident -has worked with sT on this, modified diet and supplements continued  6. Tobacco abuse -has not felt well enough to go out to smoke as often lately  Functional status:  Dependent in adls except feeding self  Family/ staff Communication: discussed with nursing  Labs/tests ordered:  No new labs

## 2013-10-21 ENCOUNTER — Encounter: Payer: Self-pay | Admitting: Internal Medicine

## 2013-10-21 NOTE — Progress Notes (Signed)
This encounter was created in error - please disregard.

## 2013-10-27 ENCOUNTER — Other Ambulatory Visit: Payer: Self-pay | Admitting: *Deleted

## 2013-10-27 MED ORDER — PREGABALIN 75 MG PO CAPS: 75.0000 mg | ORAL_CAPSULE | Freq: Three times a day (TID) | ORAL | Status: AC

## 2013-10-27 NOTE — Telephone Encounter (Signed)
[facility] [place] 

## 2013-11-06 ENCOUNTER — Other Ambulatory Visit: Payer: Self-pay | Admitting: *Deleted

## 2013-11-06 MED ORDER — OXYCODONE HCL ER 10 MG PO T12A: EXTENDED_RELEASE_TABLET | ORAL | Status: AC

## 2013-11-06 NOTE — Telephone Encounter (Signed)
Rx faxed to [facility] @ [phone]

## 2013-11-25 ENCOUNTER — Other Ambulatory Visit: Payer: Self-pay | Admitting: *Deleted

## 2013-11-25 MED ORDER — HYDROCODONE-ACETAMINOPHEN 5-325 MG PO TABS: ORAL_TABLET | ORAL | Status: AC

## 2013-11-25 NOTE — Telephone Encounter (Signed)
[facility] [place] sent note stating they needed a Rx because they never received one for this back in 07/20/2013 and have been labeled non compliance. Need due to [facility] Regulations

## 2013-11-27 DEATH — deceased

## 2014-02-01 ENCOUNTER — Encounter: Payer: Self-pay | Admitting: Internal Medicine

## 2014-05-01 ENCOUNTER — Encounter: Payer: Self-pay | Admitting: Internal Medicine
